# Patient Record
Sex: Female | Born: 1965
Health system: Southern US, Community
[De-identification: ages and names within clinical notes are randomized; demographics above are authoritative.]

---

## 1998-09-05 ENCOUNTER — Other Ambulatory Visit: Admission: RE | Admit: 1998-09-05 | Discharge: 1998-09-05 | Payer: Self-pay | Admitting: Obstetrics and Gynecology

## 2000-01-29 ENCOUNTER — Encounter: Payer: Self-pay | Admitting: Obstetrics and Gynecology

## 2000-01-29 ENCOUNTER — Inpatient Hospital Stay (HOSPITAL_COMMUNITY): Admission: AD | Admit: 2000-01-29 | Discharge: 2000-01-29 | Payer: Self-pay | Admitting: Obstetrics and Gynecology

## 2000-02-14 ENCOUNTER — Other Ambulatory Visit: Admission: RE | Admit: 2000-02-14 | Discharge: 2000-02-14 | Payer: Self-pay | Admitting: Obstetrics and Gynecology

## 2000-07-21 ENCOUNTER — Inpatient Hospital Stay (HOSPITAL_COMMUNITY): Admission: AD | Admit: 2000-07-21 | Discharge: 2000-07-25 | Payer: Self-pay | Admitting: Obstetrics and Gynecology

## 2000-08-25 ENCOUNTER — Other Ambulatory Visit: Admission: RE | Admit: 2000-08-25 | Discharge: 2000-08-25 | Payer: Self-pay | Admitting: Obstetrics and Gynecology

## 2001-10-06 ENCOUNTER — Other Ambulatory Visit: Admission: RE | Admit: 2001-10-06 | Discharge: 2001-10-06 | Payer: Self-pay | Admitting: Obstetrics and Gynecology

## 2001-10-17 ENCOUNTER — Emergency Department (HOSPITAL_COMMUNITY): Admission: EM | Admit: 2001-10-17 | Discharge: 2001-10-17 | Payer: Self-pay | Admitting: Emergency Medicine

## 2003-05-30 ENCOUNTER — Other Ambulatory Visit: Admission: RE | Admit: 2003-05-30 | Discharge: 2003-05-30 | Payer: Self-pay | Admitting: Obstetrics and Gynecology

## 2003-08-10 ENCOUNTER — Inpatient Hospital Stay (HOSPITAL_COMMUNITY): Admission: AD | Admit: 2003-08-10 | Discharge: 2003-08-10 | Payer: Self-pay | Admitting: Obstetrics and Gynecology

## 2004-02-07 ENCOUNTER — Ambulatory Visit (HOSPITAL_COMMUNITY): Admission: RE | Admit: 2004-02-07 | Discharge: 2004-02-07 | Payer: Self-pay | Admitting: Obstetrics and Gynecology

## 2004-02-07 ENCOUNTER — Encounter (INDEPENDENT_AMBULATORY_CARE_PROVIDER_SITE_OTHER): Payer: Self-pay | Admitting: *Deleted

## 2004-05-02 ENCOUNTER — Encounter (INDEPENDENT_AMBULATORY_CARE_PROVIDER_SITE_OTHER): Payer: Self-pay | Admitting: Specialist

## 2004-05-02 ENCOUNTER — Ambulatory Visit (HOSPITAL_COMMUNITY): Admission: RE | Admit: 2004-05-02 | Discharge: 2004-05-02 | Payer: Self-pay | Admitting: Obstetrics and Gynecology

## 2004-11-05 ENCOUNTER — Ambulatory Visit: Payer: Self-pay | Admitting: Hematology & Oncology

## 2005-01-28 ENCOUNTER — Ambulatory Visit: Payer: Self-pay | Admitting: Hematology & Oncology

## 2005-05-01 ENCOUNTER — Ambulatory Visit: Payer: Self-pay | Admitting: Hematology & Oncology

## 2005-06-02 ENCOUNTER — Inpatient Hospital Stay (HOSPITAL_COMMUNITY): Admission: RE | Admit: 2005-06-02 | Discharge: 2005-06-05 | Payer: Self-pay | Admitting: Obstetrics and Gynecology

## 2005-06-02 ENCOUNTER — Encounter (INDEPENDENT_AMBULATORY_CARE_PROVIDER_SITE_OTHER): Payer: Self-pay | Admitting: *Deleted

## 2005-06-06 ENCOUNTER — Encounter: Admission: RE | Admit: 2005-06-06 | Discharge: 2005-07-05 | Payer: Self-pay | Admitting: Obstetrics and Gynecology

## 2005-06-19 ENCOUNTER — Ambulatory Visit: Payer: Self-pay | Admitting: Hematology & Oncology

## 2005-07-06 ENCOUNTER — Encounter: Admission: RE | Admit: 2005-07-06 | Discharge: 2005-08-05 | Payer: Self-pay | Admitting: Obstetrics and Gynecology

## 2005-08-06 ENCOUNTER — Encounter: Admission: RE | Admit: 2005-08-06 | Discharge: 2005-09-04 | Payer: Self-pay | Admitting: Obstetrics and Gynecology

## 2005-09-05 ENCOUNTER — Encounter: Admission: RE | Admit: 2005-09-05 | Discharge: 2005-10-05 | Payer: Self-pay | Admitting: Obstetrics and Gynecology

## 2005-10-06 ENCOUNTER — Encounter: Admission: RE | Admit: 2005-10-06 | Discharge: 2005-11-05 | Payer: Self-pay | Admitting: Obstetrics and Gynecology

## 2005-11-06 ENCOUNTER — Encounter: Admission: RE | Admit: 2005-11-06 | Discharge: 2005-12-06 | Payer: Self-pay | Admitting: Obstetrics and Gynecology

## 2005-12-07 ENCOUNTER — Encounter: Admission: RE | Admit: 2005-12-07 | Discharge: 2006-01-01 | Payer: Self-pay | Admitting: Obstetrics and Gynecology

## 2008-02-16 ENCOUNTER — Encounter: Admission: RE | Admit: 2008-02-16 | Discharge: 2008-02-16 | Payer: Self-pay | Admitting: Obstetrics and Gynecology

## 2008-11-04 ENCOUNTER — Emergency Department (HOSPITAL_COMMUNITY): Admission: EM | Admit: 2008-11-04 | Discharge: 2008-11-04 | Payer: Self-pay | Admitting: Emergency Medicine

## 2009-02-16 ENCOUNTER — Encounter: Admission: RE | Admit: 2009-02-16 | Discharge: 2009-02-16 | Payer: Self-pay | Admitting: Obstetrics and Gynecology

## 2009-11-05 ENCOUNTER — Ambulatory Visit (HOSPITAL_COMMUNITY): Admission: RE | Admit: 2009-11-05 | Discharge: 2009-11-05 | Payer: Self-pay | Admitting: Obstetrics and Gynecology

## 2010-11-07 ENCOUNTER — Other Ambulatory Visit: Payer: Self-pay | Admitting: Obstetrics and Gynecology

## 2010-11-07 DIAGNOSIS — Z1231 Encounter for screening mammogram for malignant neoplasm of breast: Secondary | ICD-10-CM

## 2010-11-27 ENCOUNTER — Ambulatory Visit
Admission: RE | Admit: 2010-11-27 | Discharge: 2010-11-27 | Disposition: A | Discharge status: Home / Self Care | Payer: BC Managed Care – PPO | Source: Ambulatory Visit | Attending: Obstetrics and Gynecology | Admitting: Obstetrics and Gynecology

## 2010-11-27 DIAGNOSIS — Z1231 Encounter for screening mammogram for malignant neoplasm of breast: Secondary | ICD-10-CM

## 2010-11-27 LAB — CBC
Hemoglobin: 9.7 g/dL — ABNORMAL LOW (ref 12.0–15.0)
Platelets: 420 10*3/uL — ABNORMAL HIGH (ref 150–400)
RDW: 16.6 % — ABNORMAL HIGH (ref 11.5–15.5)
WBC: 5.4 10*3/uL (ref 4.0–10.5)

## 2010-11-29 ENCOUNTER — Other Ambulatory Visit: Payer: Self-pay | Admitting: Obstetrics and Gynecology

## 2010-11-29 DIAGNOSIS — R928 Other abnormal and inconclusive findings on diagnostic imaging of breast: Secondary | ICD-10-CM

## 2010-12-09 ENCOUNTER — Other Ambulatory Visit: Payer: BC Managed Care – PPO

## 2010-12-11 ENCOUNTER — Ambulatory Visit
Admission: RE | Admit: 2010-12-11 | Discharge: 2010-12-11 | Disposition: A | Discharge status: Home / Self Care | Payer: BC Managed Care – PPO | Source: Ambulatory Visit | Attending: Obstetrics and Gynecology | Admitting: Obstetrics and Gynecology

## 2010-12-11 DIAGNOSIS — R928 Other abnormal and inconclusive findings on diagnostic imaging of breast: Secondary | ICD-10-CM

## 2011-01-24 NOTE — Op Note (Signed)
Erin Snow, Erin Snow                        ACCOUNT NO.:  [id number]   MEDICAL RECORD NO.:  [id number]                   PATIENT TYPE:  AMB   LOCATION:  SDC                                  FACILITY:  [facility]   PHYSICIAN:  Lenoard Aden, M.D.             DATE OF BIRTH:  12-Jun-1966   DATE OF PROCEDURE:  02/07/2004  DATE OF DISCHARGE:                                 OPERATIVE REPORT   PREOPERATIVE DIAGNOSIS:  Missed abortion at nine weeks, recurrent pregnancy  loss.   POSTOPERATIVE DIAGNOSIS:  Missed abortion at nine weeks, recurrent pregnancy  loss.   PROCEDURE:  Suction dilatation and evacuation.   SURGEON:  Lenoard Aden, M.D.   ANESTHESIA:  MAC and paracervical.   ESTIMATED BLOOD LOSS:  Less than 50 mL.   COMPLICATIONS:  None.   DRAINS:  None.   COUNTS:  Correct.   SPECIMENS:  Tissue to pathology and for chromosomal analysis.   DISPOSITION:  Patient to the recovery room in good condition.   OPERATIVE PROCEDURE:  After being appraised of the risks of anesthesia,  infection, bleeding, uterine perforation, with possible need for repair to  bowel, bladder, and other injury to organs, with inability to completely  remove the fetal tissue, the patient was brought to the operating room where  she was administered IV sedation without complications, prepped and draped  in the usual sterile fashion, catheterized until the bladder was empty.  Exam under anesthesia reveals a 6-8 weeks anteverted uterus, no adnexal  masses.  A dilute paracervical block placed, 20 mL in 2% Xylocaine solution.  The uterus was sounded to 9 cm, easily dilated up to a #25 Pratt dilator.  A  8 mm curved suction curet was placed.  Suction was initiated and minimal to  moderate amount of tissue was obtained.  Blunt curettage in a four quadrant  method reveals the cavity to be empty.  Repeat suction curettage confirms.  Good hemostasis noted.  The instruments were removed from the vagina.  The  patient tolerated the procedure well and was transferred to the recovery  room in good condition.  Tissue sent for chromosomal analysis.                                               Lenoard Aden, M.D.    RJT/MEDQ  D:  02/07/2004  T:  02/07/2004  Job:  [id number]

## 2011-01-24 NOTE — Discharge Summary (Signed)
NAMELINCOLN, KLEINER              ACCOUNT NO.:  [id number]   MEDICAL RECORD NO.:  [id number]          PATIENT TYPE:  INP   LOCATION:  9141                          FACILITY:  [facility]   PHYSICIAN:  Lenoard Aden, M.D.DATE OF BIRTH:  Jan 14, 1966   DATE OF ADMISSION:  06/02/2005  DATE OF DISCHARGE:  06/05/2005                                 DISCHARGE SUMMARY   The patient underwent uncomplicated repeat cesarean section and tubal  ligation on June 03, 2005.  Postoperative course uncomplicated.  Discharge teaching done. Tolerated regular diet well.  Restarted on Lovenox  24 hours post cesarean section for known antiphospholipid antibody syndrome.  Discharged to home on day #3.  Discharge teaching done.  Discharge  medications are Lovenox, prenatal vitamins and Tylox.  She will follow up  with Dr. Myna Hidalgo, hematologist.  Follow up in our office in four weeks.      Lenoard Aden, M.D.  Electronically Signed     RJT/MEDQ  D:  07/06/2005  T:  07/07/2005  Job:  [id number]

## 2011-01-24 NOTE — Op Note (Signed)
[facility]  Patient:    Erin Snow, Erin Snow                       MRN: [id number] Proc. Date: 07/22/00 Attending:  Silverio Lay, M.D.                           Operative Report  PREOPERATIVE DIAGNOSIS:       Intrauterine pregnancy at 37 weeks and 2 days,                               failure to progress with arrest in transverse.  POSTOPERATIVE DIAGNOSIS:      Intrauterine pregnancy at 37 weeks and 2 days,                               failure to progress with arrest in transverse                               plus two nuchal cord loop, and one body loop.  OPERATION:                    Primary low transverse cesarean section.  SURGEON:                      Silverio Lay, M.D.  ANESTHESIA:                   Epidural.  ESTIMATED BLOOD LOSS:         600 cc.  INDICATIONS:                  This is a primigravida who had spontaneously ruptured her membranes at 3 p.m. on July 21, 2000, at 37 weeks and 2 days with clear fluid and group B strep negative who was allowed to enter into spontaneous labor.  She progressed normally from her admission exam of 1 cm to 6 cm, and remained at 6 cm for the last four hours of her labor.  Despite an adequate labor with MVUs at 190-200.  Cervical anterior lip was greatly edematous, and the baby was arrested in left transverse position at -1 station.   The decision was then made to proceed with primary low transverse cesarean section.  Procedures as well as complications including bleeding, infection, injury to bowel, bladder, and ureters were well-discussed with patient and husband, and informed consent was obtained.  DESCRIPTION OF PROCEDURE:     Pre-existing epidural was reinforced.  The patient was placed in the supine position with the pelvis tilted to the left, prepped and draped in a sterile fashion, and the Foley catheter was already in place.  After assessing an adequate level of anesthesia, the  skin, subcutaneous tissue, and fat were incised in a low transverse fashion.  The fascia was incised in a low transverse fashion.  The linea alba was dissected and peritoneum was entered in the midline fashion.  The visceral peritoneum was then incised in a low transverse fashion, allowing [place] to develop bladder flap, and safely retract bladder.  The uterus was then entered.  The myometrium was then entered in a low transverse fashion, first with the knife and then with scissors.  Amniotic fluid was clear.  We assisted the birth of a female infant at 4:49 a.m. in a left transverse presentation.  Mouth and nose were suctioned.  Two nuchal cord loops were reduced and the body of the baby was then delivered, allowing [place] to see a third body loop.  Cord was clamped with two Kelly clamps and _________, and the baby was given to the pediatrician present in the room.  Twenty cubic centimeters of blood were drawn from umbilical vein.  Placenta was allowed to spontaneously delivered with complete and cord had three vessels.  Uterine revision was negative.  We proceeded with closure of hysterotomy in two layers, first with a running locked suture of 0 Vicryl, then with a Lambert suture of 0 Vicryl covering the first one.  Hemostasis on the uterine incision was _______ and adequate. Both pericolic gutters were cleansed.  Both tubes and ovaries assessed and normal.  Hemostasis was reassessed on the uterine incision and adequate.  On the fascia, hemostasis was completed with cautery.  The fascia was closed with two running suture of 0 Vicryl meeting midline.  Fat and subcutaneous tissue were irrigated with warm saline.  Hemostasis was obtained with cautery.  The wound was infiltrated with 10 cc of Marcaine 0.25 and the skin was closed with staples.  Instruments and sponge count is complete x 2.  Estimated blood loss is 600 cc.  The baby received an Apgar of 8 at one minute and 9 at five minutes, and weighed  6 pounds and 10 ounces.                                The procedure was very well-tolerated by the patient who was taken to the recovery room in a well and stable condition. DD:  07/22/00 TD:  07/22/00 Job: [id number]

## 2011-01-24 NOTE — Op Note (Signed)
[name], Erin Snow                        ACCOUNT NO.:  [id number]   MEDICAL RECORD NO.:  [id number]                   PATIENT TYPE:  AMB   LOCATION:  SDC                                  [facility]ACILITY:  [facility]   PHYSICIAN:  Lenoard Aden, M.D.             DATE OF BIRTH:  20-Mar-1966   DATE OF PROCEDURE:  05/02/2004  DATE OF DISCHARGE:                                 OPERATIVE REPORT   PREOPERATIVE DIAGNOSIS:  Recurrent pregnancy loss, dysfunctional uterine  bleeding.   POSTOPERATIVE DIAGNOSIS:  Posterior wall endometrial polyp.   OPERATION PERFORMED:  Diagnostic hysteroscopy, resectoscopic polypectomy,  dilation and curettage.   SURGEON:  Lenoard Aden, M.D.   ANESTHESIA:  General.   ESTIMATED BLOOD LOSS:  50 mL.   COMPLICATIONS:  None.   DRAINS:  None.   COUNTS:  Correct.   Patient to recovery in good condition.   FLUID DEFICIT:  60 mL.   DESCRIPTION OF PROCEDURE:  After being apprised of the risks of anesthesia,  infection, bleeding, uterine perforation and possible need for repair, the  patient was brought to the operating room where she was administered a  general anesthetic without complications, prepped and draped in the usual  sterile fashion, catheterized until the bladder was empty.  Examination  under anesthesia revealed an anteflexed uterus and no adnexal masses.  At  this time 16 mL of a dilute Pitressin solution placed at 3 and 9 o'clock at  the cervicovaginal  junction. Cervix easily dilated up to a #31 Pratt  dilator.  Hysteroscope placed.  Visualization revealed a sessile posterior  wall polyp which was resected in multiple passes using a right angle double  loop.  D&C was performed without difficulty.  Bilateral normal tubal ostia  are noted.  No evidence of uterine septum or submucous fibroids.  At this  time the procedure was terminated.  Fluid loss deficit of 60 mL was noted.  All instruments were removed from the vagina.  No bleeding noted.   Good  hemostasis obtained.  The patient tolerated the procedure well, was awakened  and transferred to recovery in good condition.                                               Lenoard Aden, M.D.    RJT/MEDQ  D:  05/02/2004  T:  05/02/2004  Job:  [id number]

## 2011-01-24 NOTE — Discharge Summary (Signed)
[facility]  Patient:    Erin Snow, Erin Snow                     MRN: [id number] Adm. Date:  [id number] Disc. Date: [id number] Attending:  Silverio Lay A                           Discharge Summary  REASON FOR ADMISSION:         Intrauterine pregnancy at 37 weeks and two days                               with spontaneous rupture of membranes.  HISTORY OF PRESENT ILLNESS:   This is a 45 year old white female, gravida 3, para 0, with a due date of August 10, 2000 being admitted on July 21, 2000 at 37 weeks and 2 days with spontaneous rupture of membranes at 3:10 a.m. She went into spontaneous labor with a good contraction pattern but lacking some intensity. Two attempts at Pitocin augmentation were made which were non-tolerated by the baby with late decelerations and even prolonged decelerations, and this led to an absence in change of cervical exam in more than four hours with the cervix arrested at 5 to 6 cm, increasing edema on the anterior lip of the cervix, and a baby arrested in right transverse presentation.  Due to failure to progress, patient was consented for primary low transverse cesarean section.  She underwent this procedure with the birth of a female infant born at 4:49 a.m., Apgars 8 and 9, weighing 6 pounds 10 ounces without complication. Her estimated blood loss during procedure was 600 cc. During procedure, two nuchal cords and one body cord loop were found, maybe explaining the fetal heart rate pattern with more intense contractions.  Her postoperative course was uneventful and patient received her discharge home on postoperative day #3, or July 25, 2000 in a well and stable condition with a postop hemoglobin of 10.9.  FINAL DIAGNOSES:              1. Intrauterine pregnancy at 37 weeks and 2 days                                  with failure to progress.                               2. Status post primary low transverse  cesarean                                  section.  CONDITION AT DISCHARGE:       Well and stable.  MEDICATION AND RECOMMENDATION AT DISCHARGE:  Patient received a prescription of Motrin 800 mg as well as Tylox for pain control. She was instructed to come back in the office for a staple removal as well as her postpartum appointment in four to six weeks. She was also instructed to call if experiencing increased bleeding, increased pain or fever above 100.4. DD:  08/25/00 TD:  08/25/00 Job: [id number]

## 2011-01-24 NOTE — H&P (Signed)
Erin Snow, Erin Snow                        ACCOUNT NO.:  [id number]   MEDICAL RECORD NO.:  [id number]                   PATIENT TYPE:  AMB   LOCATION:  SDC                                  FACILITY:  [facility]   PHYSICIAN:  Lenoard Aden, M.D.             DATE OF BIRTH:  13-Sep-1965   DATE OF ADMISSION:  05/02/2004  DATE OF DISCHARGE:                                HISTORY & PHYSICAL   CHIEF COMPLAINT:  History of recurrent pregnancy loss with questionable  structural defect.   HISTORY OF PRESENT ILLNESS:  The patient is a 45 year old white female G5,  P1 with history of recurrent pregnancy loss and questionable structural  lesion who presents for definitive therapy.   PAST MEDICAL HISTORY:  Remarkable for SAB x 3, TAB x1 and 1 successful  pregnancy.   MEDICATIONS:  Prenatal vitamins.   ALLERGIES:  No known drug allergies.   FAMILY HISTORY:  Adult onset melanoma.   Blood type A negative.  She also has a history of cryosurgery in 1991.  She  denies domestic physical violence.  She is a smoker.   PHYSICAL EXAMINATION:  GENERAL:  She is a well-developed, well-nourished  white female in no acute distress.  HEENT:  Normal.  LUNGS:  Clear.  HEART:  Regular rhythm.  ABDOMEN:  Soft, nontender.  PELVIC EXAM:  Reveals an anteflexed uterus and no adnexal masses.   IMPRESSION:  Recurrent pregnancy loss, questionable structural lesion for  hysteroscopy.   PLAN:  Proceed with diagnostic hysteroscopy, resectoscopic surgery, D&C.  Risks of anesthesia, infection, bleeding, injury to abdominal organs and  need for _______discussed.  The patient is aware of the complications to  include bowel or bladder injury.  The patient just wishes to proceed.                                               Lenoard Aden, M.D.    RJT/MEDQ  D:  05/01/2004  T:  05/02/2004  Job:  [id number]

## 2011-01-24 NOTE — H&P (Signed)
[facility]  Patient:    Erin Snow, Erin Snow                       MRN: [id number] Adm. Date:  07/21/00 Attending:  Lenoard Aden, M.D.                         History and Physical  CHIEF COMPLAINT:              Spontaneous rupture of membranes.  HISTORY OF PRESENT ILLNESS:   A 45 year old white female, G3, P0, EDD August 10, 2000, at 37+ weeks who presents with spontaneous rupture of membranes at 3:10 today.  PAST MEDICAL HISTORY:         Remarkable for TAB and SAB in 1986.  Past medical history otherwise noncontributory.  ALLERGIES:                    No known drug allergies.  MEDICATIONS:                  Prenatal vitamins.  FAMILY HISTORY:               Adult-onset diabetes and melanoma.  PRENATAL LABORATORY DATA:     Remarkable for blood type A negative, Rh antibody negative, toxoplasmosis titers negative, rubella immune, hepatitis B surface antigen negative, HIV nonreactive.  Pap smear within normal limits. Sickle screen normal.  GBS negative.  Patient received RhoGAM at 20 weeks. Prenatal course otherwise uncomplicated.  PAST GYNECOLOGIC HISTORY:     Also remarkable for cryosurgery in 1991.  SOCIAL HISTORY:               Patient denies domestic or physical violence. She is a smoker.  PHYSICAL EXAMINATION:  GENERAL:                      A well-developed, well-nourished white female in no apparent distress.  HEENT:                        Normal.  LUNGS:                        Clear.  HEART:                        Regular rate and rhythm.  ABDOMEN:                      Soft, gravid, nontender.  Estimated fetal weight 7-1/2 to 8 pounds.  PELVIC:                       Cervix is 2 cm, 70% vertex, and -1 to -2.  NEUROLOGIC:                   Exam is nonfocal.  EXTREMITIES:                  Revealed no cords.  IMPRESSION:                   1. Term intrauterine pregnancy with spontaneous                                  rupture  of membranes.  2. Rh negative.                               3. Smoker.  PLAN:                         Proceed with Pitocin augmentation as necessary anticipated after vaginal delivery. DD:  07/21/00 TD:  07/21/00 Job: [id number]

## 2011-01-24 NOTE — Op Note (Signed)
Erin Snow, Erin Snow              ACCOUNT NO.:  [id number]   MEDICAL RECORD NO.:  [id number]          PATIENT TYPE:  INP   LOCATION:  9141                          FACILITY:  [facility]   PHYSICIAN:  Lenoard Aden, M.D.DATE OF BIRTH:  September 21, 1965   DATE OF PROCEDURE:  06/02/2005  DATE OF DISCHARGE:                                 OPERATIVE REPORT   PREOPERATIVE DIAGNOSES:  1.  Thirty-eight-week intrauterine pregnancy.  2.  Antiphospholipid antibody syndrome on Lovenox.  3.  Previous cesarean section.  4.  Desires tubal ligation.   POSTOPERATIVE DIAGNOSES:  1.  Thirty-eight-week intrauterine pregnancy.  2.  Antiphospholipid antibody syndrome on Lovenox.  3.  Previous cesarean section.  4.  Desires tubal ligation.  5.  Right paratubal cyst.   OPERATION/PROCEDURE:  1.  Repeat low segment transverse cesarean section.  2.  Tubal ligation.  3.  Removal of right paratubal cyst.   SURGEON:  Lenoard Aden, M.D.   ASSISTANT:  Maxie Better, M.D.   ANESTHESIA:  Spinal by Octaviano Glow. Pamalee Leyden, M.D.   SPECIMENS:  Paratubal cyst to pathology.  Placenta to pathology.  Bilateral  partial tubal segments to pathology.   ESTIMATED BLOOD LOSS:  1000 mL.   COMPLICATIONS:  None.   DRAINS:  Foley.   COUNTS:  Correct.   DISPOSITION:  The patient recovering in good condition.   DESCRIPTION OF PROCEDURE:  After being apprised of the risks of anesthesia,  infection, bleeding, injury to abdominal organs and need for repair, failure  risk of tubal ligation 5-10 per 1000, the patient brought to the operating  room.  She was administered spinal anesthetic without complications, prepped  and draped in the usual sterile fashion.  Foley catheter placed.  After  achieving adequate anesthesia, dilute Marcaine solution placed and  Pfannenstiel skin incision made with the scalpel, carried down to the fascia  which was nicked in the midline and opened transversely using Mayo scissors.  Rectus  muscles were dissected sharply in the midline.  Peritoneum entered  sharply and bladder blade placed.  Visceral peritoneum was scored in the  smiley fashion, dissected sharply off the lower uterine segment.  Hysterotomy incision made and extended with bandage scissors.  Atraumatic  delivery.  Nuchal cord x1.  Full-term living female.  Occiput transverse  position.  Handed to pediatricians in attendance.  Apgars 9 and 9, cord  blood collected.  Placenta delivered manually intact and sent to pathology.  Uterus exteriorized and curetted using a dry lap pack, closed in two layers  using 0 Monocryl suture.  Right tube traced out to the fimbriated end.  Right paratubal cyst is excised at the base using cauterization and sent to  pathology.   At this time, avascular portion of the ampullary portion of the tube and  avascular portion of the mesosalpinx was identified,  cauterized, creating a  window, using electrocautery, proximal and distal plane ties are placed.  Tubal segment excised. Tubal lumens were visualized and cauterized.  Same  procedure which was done on the right tube is done on the left tube except  there is no  paratubal cyst on the left tube.  Otherwise tubal ligation is  performed in the same fashion on the left tube.  Good hemostasis was noted.   Bladder flap was inspected and found to be hemostatic.  Irrigation was  accomplished.  Fascia was then closed using the 0 Monocryl in 10/20 fashion.  Skin closed using staples.  The patient tolerated the procedure well and was  transferred to the recovery room in good condition.      l      Lenoard Aden, M.D.  Electronically Signed     RJT/MEDQ  D:  06/02/2005  T:  06/02/2005  Job:  [id number]   cc:   [facility]

## 2011-05-07 ENCOUNTER — Other Ambulatory Visit: Payer: Self-pay | Admitting: Obstetrics and Gynecology

## 2011-05-07 DIAGNOSIS — N6320 Unspecified lump in the left breast, unspecified quadrant: Secondary | ICD-10-CM

## 2011-06-25 ENCOUNTER — Ambulatory Visit
Admission: RE | Admit: 2011-06-25 | Discharge: 2011-06-25 | Disposition: A | Discharge status: Home / Self Care | Payer: BC Managed Care – PPO | Source: Ambulatory Visit | Attending: Obstetrics and Gynecology | Admitting: Obstetrics and Gynecology

## 2011-06-25 DIAGNOSIS — N6320 Unspecified lump in the left breast, unspecified quadrant: Secondary | ICD-10-CM

## 2011-11-12 ENCOUNTER — Other Ambulatory Visit: Payer: Self-pay | Admitting: Obstetrics and Gynecology

## 2011-11-12 DIAGNOSIS — Z1231 Encounter for screening mammogram for malignant neoplasm of breast: Secondary | ICD-10-CM

## 2011-12-01 ENCOUNTER — Ambulatory Visit
Admission: RE | Admit: 2011-12-01 | Discharge: 2011-12-01 | Disposition: A | Discharge status: Home / Self Care | Payer: BC Managed Care – PPO | Source: Ambulatory Visit | Attending: Obstetrics and Gynecology | Admitting: Obstetrics and Gynecology

## 2011-12-01 DIAGNOSIS — Z1231 Encounter for screening mammogram for malignant neoplasm of breast: Secondary | ICD-10-CM

## 2011-12-20 ENCOUNTER — Encounter (HOSPITAL_COMMUNITY): Payer: Self-pay

## 2011-12-20 ENCOUNTER — Emergency Department (HOSPITAL_COMMUNITY): Payer: BC Managed Care – PPO

## 2011-12-20 ENCOUNTER — Emergency Department (HOSPITAL_COMMUNITY)
Admission: EM | Admit: 2011-12-20 | Discharge: 2011-12-20 | Disposition: A | Discharge status: Home / Self Care | Payer: BC Managed Care – PPO | Attending: Emergency Medicine | Admitting: Emergency Medicine

## 2011-12-20 DIAGNOSIS — S239XXA Sprain of unspecified parts of thorax, initial encounter: Secondary | ICD-10-CM | POA: Insufficient documentation

## 2011-12-20 DIAGNOSIS — R059 Cough, unspecified: Secondary | ICD-10-CM | POA: Insufficient documentation

## 2011-12-20 DIAGNOSIS — Z7982 Long term (current) use of aspirin: Secondary | ICD-10-CM | POA: Insufficient documentation

## 2011-12-20 DIAGNOSIS — M546 Pain in thoracic spine: Secondary | ICD-10-CM | POA: Insufficient documentation

## 2011-12-20 DIAGNOSIS — T148XXA Other injury of unspecified body region, initial encounter: Secondary | ICD-10-CM

## 2011-12-20 DIAGNOSIS — R079 Chest pain, unspecified: Secondary | ICD-10-CM | POA: Insufficient documentation

## 2011-12-20 DIAGNOSIS — X503XXA Overexertion from repetitive movements, initial encounter: Secondary | ICD-10-CM | POA: Insufficient documentation

## 2011-12-20 DIAGNOSIS — Z79899 Other long term (current) drug therapy: Secondary | ICD-10-CM | POA: Insufficient documentation

## 2011-12-20 DIAGNOSIS — R05 Cough: Secondary | ICD-10-CM | POA: Insufficient documentation

## 2011-12-20 MED ORDER — HYDROCODONE-ACETAMINOPHEN 5-325 MG PO TABS: 1.0000 | ORAL_TABLET | ORAL | Status: AC | PRN

## 2011-12-20 MED ORDER — KETOROLAC TROMETHAMINE 60 MG/2ML IM SOLN
60.0000 mg | Freq: Once | INTRAMUSCULAR | Status: AC
  Administered 2011-12-20: 60 mg via INTRAMUSCULAR
  Filled 2011-12-20: qty 2

## 2011-12-20 MED ORDER — IBUPROFEN 800 MG PO TABS: 800.0000 mg | ORAL_TABLET | Freq: Three times a day (TID) | ORAL | Status: AC

## 2011-12-20 NOTE — ED Notes (Signed)
PA at bedside.

## 2011-12-20 NOTE — Discharge Instructions (Signed)
Take vicodin as prescribed for severe pain.   Do not drive within four hours of taking this medication (may cause drowsiness or confusion).  Take ibuprofen w/ food up to three times a day as well.   Use incentive spirometer once an hour while your awake until it no longer hurts to breath.  Apply heat or ice 2-3 times a day.  Avoid activities that aggravate pain.  Follow up with your family doctor if pain has not started to improve in 5-7 days.  You should return to the ER if your pain worsens, you develop difficulty breathing or associated fever.

## 2011-12-20 NOTE — ED Notes (Signed)
Pt in from home  with c/o back pain states injures several weeks ago states while bending today pain worsened denies pain radiating to legs states heard a "pop" sound when bending

## 2011-12-20 NOTE — ED Provider Notes (Signed)
History     CSN: [id number]  Arrival date & time 12/20/11  1327   First MD Initiated Contact with Patient 12/20/11 1515      Chief Complaint  Patient presents with  . Back Pain    (Consider location/radiation/quality/duration/timing/severity/associated sxs/prior treatment) HPI History provided by pt.   Pt has had soreness right posterior ribs for the past 3-4 months since leaning against a shelf while inside a cabinet, painting.  Today she bent over and had a sharp pain in the same location.  Pain has been constant and severe ever since and aggravated by movement and coughing.  Pain feels similar to what she had been experiencing, but worse.  Has mild cough that she attributes to allergies.  Denies fever, SOB, LE pain/edema.  No RF for PE.    History reviewed. No pertinent past medical history.  History reviewed. No pertinent past surgical history.  No family history on file.  History  Substance Use Topics  . Smoking status: Current Everyday Smoker  . Smokeless tobacco: Not on file  . Alcohol Use: No    OB History    Grav Para Term Preterm Abortions TAB SAB Ect Mult Living                  Review of Systems  All other systems reviewed and are negative.    Allergies  Wellbutrin  Home Medications   Current Outpatient Rx  Name Route Sig Dispense Refill  . ASPIRIN EC 81 MG PO TBEC Oral Take 81 mg by mouth daily.    Marland Kitchen CALCIUM CARBONATE-VITAMIN D 500-200 MG-UNIT PO TABS Oral Take 1 tablet by mouth daily.    Marland Kitchen JUICE PLUS FIBRE PO Oral Take 1 capsule by mouth every morning.    Marland Kitchen SERTRALINE HCL 100 MG PO TABS Oral Take 100 mg by mouth daily.      BP 116/88  Pulse 76  Temp(Src) 98.6 F (37 C) (Oral)  Resp 20  SpO2 100%  LMP 11/11/2011  Physical Exam  Nursing note and vitals reviewed. Constitutional: She is oriented to person, place, and time. She appears well-developed and well-nourished. No distress.  HENT:  Head: Normocephalic and atraumatic.  Eyes:   Normal appearance  Neck: Normal range of motion.  Cardiovascular: Normal rate and regular rhythm.   Pulmonary/Chest: Effort normal and breath sounds normal.       Pain w/ deep inspiration  Musculoskeletal: Normal range of motion.       Entire spine non-tender.  Mild tenderness right upper side but upper back non-tender.  Pain aggravated by passive abduction RUE and twisting of torso to the left.  No LE edema/tenderness.  Neurological: She is alert and oriented to person, place, and time.  Skin: Skin is warm and dry. No rash noted.       No ecchymosis  Psychiatric: She has a normal mood and affect. Her behavior is normal.    ED Course  Procedures (including critical care time)  Labs Reviewed - No data to display Dg Ribs Unilateral W/chest Right  12/20/2011  *RADIOLOGY REPORT*  Clinical Data: Back pain.  Right axillary rib injury.  RIGHT RIBS AND CHEST - 3+ VIEW  Comparison: None.  Findings: This is irregularity and nodularity of the left anterior first and second ribs.  This could represent old fractures or a developmental variant. Linear density at the right lung base may represent scarring.  There is lucency in the upper lung fields.  No evidence for a pneumothorax.  No evidence for a displaced right rib fracture.  IMPRESSION: No evidence for a displaced right rib fracture.  Unusual appearance of the left first and second ribs could represent an old injury versus a developmental variant.  Marked lucency in the upper lungs.  Findings raise concern for underlying lung disease and cannot exclude emphysematous changes.  Original Report Authenticated By: Richarda Overlie, M.D.     1. Muscle strain       MDM  Pt presents w/ right upper back and side pain x 3-4 weeks, attributed to laying on this side while painting a cabinet.  Re-aggravated today when she bent over.  Pain reproducible on exam w/ ROM RUE, twisting torso to left, palpation of right side and deep inspiration.  Pneumonia and PE unlikely  based on history and exam.  Xray ribs pending to r/o fracture.  Suspect muscle strain.  Toradol ordered for pain.  3:24 PM   CXR neg for rib fx.  Pt is aware of findings consistent w/ emphysema and will discuss with her doctor and try to cut back on cigarettes.  Received and incentive spirometer and d/c'd home w/ ibuprofen and vicodin for pain.  Had relief w/ toradol.  Return precautions discussed.        Otilio Miu, [place] 12/20/11 [phone]

## 2011-12-20 NOTE — ED Notes (Signed)
RT called for Incentive Spirometer, pt aware.

## 2011-12-21 NOTE — ED Provider Notes (Signed)
Medical screening examination/treatment/procedure(s) were performed by non-physician practitioner and as supervising physician I was immediately available for consultation/collaboration.   Graysyn Bache E Joseh Sjogren, MD 12/21/11 1543 

## 2013-01-24 ENCOUNTER — Other Ambulatory Visit: Payer: Self-pay

## 2013-01-24 DIAGNOSIS — Z1231 Encounter for screening mammogram for malignant neoplasm of breast: Secondary | ICD-10-CM

## 2013-02-11 ENCOUNTER — Ambulatory Visit
Admission: RE | Admit: 2013-02-11 | Discharge: 2013-02-11 | Disposition: A | Discharge status: Home / Self Care | Payer: BC Managed Care – PPO | Source: Ambulatory Visit

## 2013-02-11 DIAGNOSIS — Z1231 Encounter for screening mammogram for malignant neoplasm of breast: Secondary | ICD-10-CM

## 2014-06-12 ENCOUNTER — Other Ambulatory Visit: Payer: Self-pay

## 2014-06-12 DIAGNOSIS — Z1239 Encounter for other screening for malignant neoplasm of breast: Secondary | ICD-10-CM

## 2014-06-29 ENCOUNTER — Other Ambulatory Visit: Payer: Self-pay

## 2014-06-29 DIAGNOSIS — Z1231 Encounter for screening mammogram for malignant neoplasm of breast: Secondary | ICD-10-CM

## 2014-07-05 ENCOUNTER — Ambulatory Visit
Admission: RE | Admit: 2014-07-05 | Discharge: 2014-07-05 | Disposition: A | Discharge status: Home / Self Care | Payer: BC Managed Care – PPO | Source: Ambulatory Visit

## 2014-07-05 DIAGNOSIS — Z1231 Encounter for screening mammogram for malignant neoplasm of breast: Secondary | ICD-10-CM

## 2016-01-03 DIAGNOSIS — F32A Depression, unspecified: Secondary | ICD-10-CM | POA: Insufficient documentation

## 2016-01-03 DIAGNOSIS — F329 Major depressive disorder, single episode, unspecified: Secondary | ICD-10-CM | POA: Insufficient documentation

## 2016-01-03 DIAGNOSIS — F17200 Nicotine dependence, unspecified, uncomplicated: Secondary | ICD-10-CM | POA: Insufficient documentation

## 2017-11-16 ENCOUNTER — Ambulatory Visit: Payer: BLUE CROSS/BLUE SHIELD | Admitting: Podiatry

## 2017-11-16 ENCOUNTER — Ambulatory Visit (INDEPENDENT_AMBULATORY_CARE_PROVIDER_SITE_OTHER): Payer: BLUE CROSS/BLUE SHIELD

## 2017-11-16 DIAGNOSIS — M722 Plantar fascial fibromatosis: Secondary | ICD-10-CM | POA: Diagnosis not present

## 2017-11-16 MED ORDER — MELOXICAM 15 MG PO TABS: 15.0000 mg | ORAL_TABLET | Freq: Every day | ORAL | 0 refills | Status: DC

## 2017-11-16 NOTE — Progress Notes (Signed)
   Subjective:    Patient ID: Erin Snow, female    DOB: March 16, 1966, 52 y.o.   MRN: [id number]  HPI    Review of Systems  All other systems reviewed and are negative.      Objective:   Physical Exam        Assessment & Plan:

## 2017-11-17 NOTE — Progress Notes (Signed)
   Subjective: Patient presents today for pain and tenderness in the heels bilaterally that began about 3 months ago. She reports an associated nodule noted to the left arch. She states that it hurts in the morning with the first steps out of bed and that the left foot is worse than the right. She has not done anything to treat the symptoms. Walking and bearing weight increases the pain. Patient presents today for further treatment and evaluation.  No past medical history on file.   Objective: Physical Exam General: The patient is alert and oriented x3 in no acute distress.  Dermatology: Skin is warm, dry and supple bilateral lower extremities. Negative for open lesions or macerations bilateral.   Vascular: Dorsalis Pedis and Posterior Tibial pulses palpable bilateral.  Capillary fill time is immediate to all digits.  Neurological: Epicritic and protective threshold intact bilateral.   Musculoskeletal: Tenderness to palpation to the plantar aspect of the bilateral heels along the plantar fascia. All other joints range of motion within normal limits bilateral. Strength 5/5 in all groups bilateral.   Radiographic exam: Normal osseous mineralization. Joint spaces preserved. No fracture/dislocation/boney destruction. No other soft tissue abnormalities or radiopaque foreign bodies.   Assessment: 1. plantar fasciitis bilateral feet  Plan of Care:  1. Patient evaluated. Xrays reviewed.   2. Injection of 0.5cc Celestone soluspan injected into the bilateral heels.  3. Prescription for Meloxicam provided to patient.  4. Appointment with Raiford Nobleick for custom molded orthotics.  5. Plantar fascial band(s) dispensed for bilateral plantar fasciitis. 6. Instructed patient regarding therapies and modalities at home to alleviate symptoms.  7. Return to clinic in 4 weeks.    Felecia ShellingBrent M. Kamaiya Antilla, DPM [facility]  Dr. Felecia ShellingBrent M. Bj Morlock, DPM    [address]                                     [place], [place] [address]                Office [phone]  Fax [phone]

## 2017-11-30 ENCOUNTER — Ambulatory Visit (INDEPENDENT_AMBULATORY_CARE_PROVIDER_SITE_OTHER): Payer: BLUE CROSS/BLUE SHIELD | Admitting: Orthotics

## 2017-11-30 DIAGNOSIS — M722 Plantar fascial fibromatosis: Secondary | ICD-10-CM | POA: Diagnosis not present

## 2017-11-30 NOTE — Progress Notes (Signed)

## 2017-12-14 ENCOUNTER — Ambulatory Visit: Payer: BLUE CROSS/BLUE SHIELD | Admitting: Podiatry

## 2017-12-14 DIAGNOSIS — M722 Plantar fascial fibromatosis: Secondary | ICD-10-CM

## 2017-12-16 NOTE — Progress Notes (Signed)
   Subjective: 52 year old female presenting today for follow up evaluation of bilateral plantar fasciitis. She states the pain has improved but is still present intermittently in the left heel. The injections helped provided some relief of the pain. Patient presents today for further treatment and evaluation.  No past medical history on file.   Objective: Physical Exam General: The patient is alert and oriented x3 in no acute distress.  Dermatology: Skin is warm, dry and supple bilateral lower extremities. Negative for open lesions or macerations bilateral.   Vascular: Dorsalis Pedis and Posterior Tibial pulses palpable bilateral.  Capillary fill time is immediate to all digits.  Neurological: Epicritic and protective threshold intact bilateral.   Musculoskeletal: Tenderness to palpation to the plantar aspect of the bilateral heels along the plantar fascia. All other joints range of motion within normal limits bilateral. Strength 5/5 in all groups bilateral.    Assessment: 1. plantar fasciitis bilateral feet - improved   Plan of Care:  1. Patient evaluated.  2. Continue taking OTC Motrin and wearing plantar fascial braces.  3. Patient picks up custom molded orthotics in one week.  4. Return to clinic as needed.    Felecia ShellingBrent M. Evans, DPM [facility]  Dr. Felecia ShellingBrent M. Evans, DPM    [address]                                   [place], [place] [address]                Office [phone]  Fax [phone]

## 2017-12-17 ENCOUNTER — Other Ambulatory Visit: Payer: Self-pay | Admitting: Podiatry

## 2017-12-21 ENCOUNTER — Other Ambulatory Visit: Payer: BLUE CROSS/BLUE SHIELD | Admitting: Orthotics

## 2017-12-28 ENCOUNTER — Ambulatory Visit: Payer: BLUE CROSS/BLUE SHIELD | Admitting: Orthotics

## 2017-12-28 DIAGNOSIS — M722 Plantar fascial fibromatosis: Secondary | ICD-10-CM

## 2017-12-28 NOTE — Progress Notes (Signed)
Patient came in today to pick up custom made foot orthotics.  The goals were accomplished and the patient reported no dissatisfaction with said orthotics.  Patient was advised of breakin period and how to report any issues. 

## 2018-07-12 ENCOUNTER — Other Ambulatory Visit: Payer: Self-pay | Admitting: Physician Assistant

## 2018-07-12 ENCOUNTER — Ambulatory Visit
Admission: RE | Admit: 2018-07-12 | Discharge: 2018-07-12 | Disposition: A | Discharge status: Home / Self Care | Payer: BLUE CROSS/BLUE SHIELD | Source: Ambulatory Visit | Attending: Physician Assistant | Admitting: Physician Assistant

## 2018-07-12 DIAGNOSIS — R0609 Other forms of dyspnea: Principal | ICD-10-CM

## 2018-07-12 DIAGNOSIS — R0600 Dyspnea, unspecified: Secondary | ICD-10-CM

## 2018-09-24 ENCOUNTER — Ambulatory Visit
Admission: RE | Admit: 2018-09-24 | Discharge: 2018-09-24 | Disposition: A | Discharge status: Home / Self Care | Payer: BLUE CROSS/BLUE SHIELD | Source: Ambulatory Visit | Attending: Physician Assistant | Admitting: Physician Assistant

## 2018-09-24 ENCOUNTER — Other Ambulatory Visit: Payer: Self-pay | Admitting: Physician Assistant

## 2018-09-24 DIAGNOSIS — R0602 Shortness of breath: Secondary | ICD-10-CM

## 2018-10-06 ENCOUNTER — Ambulatory Visit: Payer: BLUE CROSS/BLUE SHIELD | Admitting: Internal Medicine

## 2018-10-06 ENCOUNTER — Encounter: Payer: Self-pay | Admitting: Internal Medicine

## 2018-10-06 VITALS — BP 122/80 | HR 76 | Ht 64.0 in | Wt 156.0 lb

## 2018-10-06 DIAGNOSIS — J986 Disorders of diaphragm: Secondary | ICD-10-CM | POA: Diagnosis not present

## 2018-10-06 DIAGNOSIS — R0609 Other forms of dyspnea: Secondary | ICD-10-CM | POA: Diagnosis not present

## 2018-10-06 DIAGNOSIS — Z87891 Personal history of nicotine dependence: Secondary | ICD-10-CM | POA: Diagnosis not present

## 2018-10-06 DIAGNOSIS — R059 Cough, unspecified: Secondary | ICD-10-CM

## 2018-10-06 DIAGNOSIS — R05 Cough: Secondary | ICD-10-CM | POA: Diagnosis not present

## 2018-10-06 DIAGNOSIS — R0600 Dyspnea, unspecified: Secondary | ICD-10-CM

## 2018-10-06 DIAGNOSIS — R053 Chronic cough: Secondary | ICD-10-CM

## 2018-10-06 LAB — NITRIC OXIDE: Nitric Oxide: 8

## 2018-10-06 NOTE — Progress Notes (Signed)
Subjective:    Patient ID: Erin Snow, female    DOB: [birth year], 53 y.o.   MRN: [id number]  PCP [facility]   HPI  [facility] 10/06/2018   Chief Complaint  Patient presents with  . Pulm Consult    Referred by Rueben Bash PA for SOB. Has had bronchitis in the past and since then, has been more SOB. Unable to keep up with daily activities.    Erin Snow - 52 y.o. 07-10-1966 -of [address] [place] [address] -reports insidious onset of shortness of breath and cough.  Of these the shortness of breath is the more significant problem.  As a background she smoked 1.2 packs/day for 35 years and quit 2 years ago.  She also reports remote history of some kind of a neuromuscular wasting process in her left shoulder and right hand for which she has been evaluated at [facility] and [place] years ago [place] review on care everywhere does not show any evidence of this].  Occasionally she gets muscle fasciculations. She has not been bothered by this and the condition has been deemed idiopathic.  Then in October 2019 she slipped and fell on her left side having bruising injuries to her left chest wall and also fracture to her left forearm which was operated [facility].  We do not have a chest x-ray from this time.  Then in November 2019 she says she was gardening when had some mold exposure and then subsequently picked up a bronchitis and was treated with a round of antibiotic and prednisone.  Chest x-ray November 2019 sometime during the illness or after the illness shows a left hemidiaphragm elevation but otherwise clear lung fields [she is unaware of this but will this visit].  This is according my personal visualization.  She states since the respiratory illness she is been left with her shortness of breath and cough.  Between these the cough is a milder symptom.  Currently the cough is continuing to improve and is a level 4 out of 10  with 10 being the most severe.  It is dry cough.  But she is continuing to be plagued by significant shortness of breath at the level 8/10.  It is present on exertion such as walking across the room and relieved by rest.  This is unimproved and persistent.  She also feels extremely tight in her chest and she feels something is then on the left side of the chest.  She had a chest x-ray in January 2020 but continues to show left hemidiaphragm elevation [again she is not aware of this].  This is upon my personal visualization.  There is no wheezing or edema or chest pain    Chest x-ray personally visualized shows significant left hemidiaphragmatic elevation in January 2020 and November 2019.  These are new findings compared to 2013.  Her exhaled nitric oxide today in our office was normal at 8 ppb  Simple office walk 185 feet x  3 laps goal with forehead probe 10/06/2018   O2 used Room air  Number laps completed 3  Comments about pace fast  Resting Pulse Ox/HR 99% and 83/min  Final Pulse Ox/HR 95% and 101/min  Desaturated </= 88% no  Desaturated <= 3% points Yes, 4 point  Got Tachycardic >/= 90/min yes  Symptoms at end of test Towards 2nd lap dyspneic but able to continue  Miscellaneous comments x       Review of  Systems  Constitutional: Negative for fever and unexpected weight change.  HENT: Negative for congestion, dental problem, ear pain, nosebleeds, postnasal drip, rhinorrhea, sinus pressure, sneezing, sore throat and trouble swallowing.   Eyes: Negative for redness and itching.  Respiratory: Positive for cough, chest tightness and shortness of breath. Negative for wheezing.   Cardiovascular: Negative for palpitations and leg swelling.  Gastrointestinal: Negative for nausea and vomiting.  Genitourinary: Negative for dysuria.  Musculoskeletal: Negative for joint swelling.  Skin: Negative for rash.  Allergic/Immunologic: Negative.  Negative for environmental allergies, food  allergies and immunocompromised state.  Neurological: Negative for headaches.  Hematological: Does not bruise/bleed easily.  Psychiatric/Behavioral: Negative for dysphoric mood. The patient is not nervous/anxious.      has no past medical history on file.   reports that she quit smoking about 2 years ago. Her smoking use included cigarettes. She has a 45.00 pack-year smoking history. She has never used smokeless tobacco. - > 30 ppd  No past surgical history on file.  Allergies  Allergen Reactions  . Oxycodone-Acetaminophen Swelling  . Wellbutrin [Bupropion Hcl] Swelling     There is no immunization history on file for this patient.  No family history on file.   Current Outpatient Medications:  .  ALPRAZolam (XANAX) 0.5 MG tablet, , Disp: , Rfl: 0 .  aspirin EC 81 MG tablet, Take 81 mg by mouth daily., Disp: , Rfl:  .  JEVANTIQUE LO 0.5-2.5 MG-MCG tablet, , Disp: , Rfl: 11 .  Nutritional Supplements (JUICE PLUS FIBRE PO), Take 1 capsule by mouth every morning., Disp: , Rfl:  .  sertraline (ZOLOFT) 100 MG tablet, Take 100 mg by mouth daily., Disp: , Rfl:       Objective:   Physical Exam  Vitals:   10/06/18 0909  BP: 122/80  Pulse: 76  SpO2: 94%  Weight: 156 lb (70.8 kg)  Height: 5\' 4"  (1.626 m)    Estimated body mass index is 26.78 kg/m as calculated from the following:   Height as of this encounter: 5\' 4"  (1.626 m).   Weight as of this encounter: 156 lb (70.8 kg).  General Appearance:    Alert, cooperative, no distress, appears stated age - yes , sitting on - chair  Head:    Normocephalic, without obvious abnormality, atraumatic  Eyes:    PERRL, conjunctiva/corneas clear,  Ears:    Normal TM's and external ear canals, both ears  Nose:   Nares normal, septum midline, mucosa normal, no drainage    or sinus tenderness. OXYGEN ON no @ ra  Throat:   Lips, mucosa, and tongue normal; teeth and gums normal. Cyanosis on lips - no  Neck:   Supple, symmetrical, trachea  midline, no adenopathy;    thyroid:  no enlargement/tenderness/nodules; no carotid   bruit or JVD  Back:     Symmetric, no curvature, ROM normal, no CVA tenderness  Lungs:     Distress - no , Wheeze no, Barrell Chest - no, Purse lip breathing - no, Crackles - no. DIMINISHED LEFT BASE   Chest Wall:    No tenderness or deformity. Scars in chest no.  Left shoulder does show some muscular wasting   Heart:    Regular rate and rhythm, S1 and S2 normal, no murmur, rub   or gallop  Breast Exam:    NOT DONE  Abdomen:     Soft, non-tender, bowel sounds active all four quadrants,    no masses, no organomegaly  Genitalia:  NOT DONE  Rectal:   NOT DONE  Extremities:   Extremities normal, atraumatic, Clubbing - no, Edema - no.  Around the webspaces of her right hand there is some muscular wasting.  Pulses:   2+ and symmetric all extremities  Skin:   Stigmata of Connective Tissue Disease - no  Lymph nodes:   Cervical, supraclavicular, and axillary nodes normal  Psychiatric:  Neurologic:   plesant CNII-XII intact, normal strength, sensation  throughout            Assessment & Plan:     ICD-10-CM   1. Dyspnea on exertion R06.09 Pulmonary function test  2. Chronic cough R05   3. 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 Stopped smoking with greater than 30 pack year history Z87.891 Basic metabolic panel  5. Cough R05 CT Chest High Resolution    CT Chest W Contrast    Her dyspnea Is probably being derived by her left diaphragm elevation.  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  This in turn could be possibly post viral in nature or related to the fall in October 2019 a probably related to her neuromuscular weakness.  This is yet to be determined.  In addition she could have lung parenchymal disease from her smoking and this could be in the form of emphysema interstitial lung disease.  Therefore best to evaluate for both  Patient Instructions     ICD-10-CM   1. Dyspnea on  exertion R06.09   2. Chronic cough R05   3. 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 Stopped smoking with greater than 30 pack year history Z87.891     Do sniff test Do full PFT Do HRCT supine and prone and then do Do CT chest with contrast (do blood bmet test for this)  - yes 2 types of ct chest but in same setting  Followup Next few to several weeks but after completing above   -Might have to consider referral to another tertiary center such as Kendell Bane at [facility] based on the results of these tests particularly in the context of neuromuscular wasting issues.      SIGNATURE    Dr. Kalman Shan, M.D., F.C.C.P,  Pulmonary and Critical Care Medicine Staff Physician, [facility] Center Director - Interstitial Lung Disease  Program  Pulmonary Fibrosis [facility] [place], [place], [address]  Pager: [phone], If no answer or between  15:00h - 7:00h: call [phone] Telephone: [phone]  9:57 AM 10/06/2018

## 2018-10-06 NOTE — Addendum Note (Signed)
Addended by: Maurene Capes on: 10/06/2018 10:01 AM   Modules accepted: Orders

## 2018-10-06 NOTE — Addendum Note (Signed)
Addended by: Wyvonne Lenz on: 10/06/2018 10:09 AM   Modules accepted: Orders

## 2018-10-06 NOTE — Patient Instructions (Signed)
ICD-10-CM   1. Dyspnea on exertion R06.09   2. Chronic cough R05   3. 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 Stopped smoking with greater than 30 pack year history Z87.891     Do sniff test Do full PFT Do HRCT supine and prone and then do Do CT chest with contrast (do blood bmet test for this)  - yes 2 types of ct chest but in same setting  Followup Next few to several weeks but after completing above

## 2018-10-15 ENCOUNTER — Telehealth: Payer: Self-pay | Admitting: Internal Medicine

## 2018-10-15 NOTE — Telephone Encounter (Signed)
Pt's CT for 10/22/2018 has been authorized per the [facility] Portal, auth# [id number] from 10/06/18-11/04/18.  LM on pt's VM to let her know this has been authorized.

## 2018-10-15 NOTE — Telephone Encounter (Signed)
Patient called stating that her insurance denied her CT Chest ordered by Dr Marchelle Gearing.  She stated that it needed to be resubmitted.  Message routed to [facility]

## 2018-10-21 ENCOUNTER — Other Ambulatory Visit (HOSPITAL_COMMUNITY): Payer: BLUE CROSS/BLUE SHIELD

## 2018-10-21 ENCOUNTER — Ambulatory Visit (HOSPITAL_COMMUNITY): Payer: BLUE CROSS/BLUE SHIELD

## 2018-10-22 ENCOUNTER — Ambulatory Visit (HOSPITAL_COMMUNITY)
Admission: RE | Admit: 2018-10-22 | Discharge: 2018-10-22 | Disposition: A | Discharge status: Home / Self Care | Payer: BLUE CROSS/BLUE SHIELD | Source: Ambulatory Visit | Attending: Internal Medicine | Admitting: Internal Medicine

## 2018-10-22 ENCOUNTER — Other Ambulatory Visit: Payer: Self-pay | Admitting: Internal Medicine

## 2018-10-22 DIAGNOSIS — J986 Disorders of diaphragm: Secondary | ICD-10-CM | POA: Insufficient documentation

## 2018-10-22 DIAGNOSIS — R05 Cough: Secondary | ICD-10-CM | POA: Diagnosis not present

## 2018-10-22 DIAGNOSIS — R059 Cough, unspecified: Secondary | ICD-10-CM

## 2018-10-22 MED ORDER — IOHEXOL 300 MG/ML  SOLN
75.0000 mL | Freq: Once | INTRAMUSCULAR | Status: AC | PRN
  Administered 2018-10-22: 75 mL via INTRAVENOUS

## 2018-10-22 MED ORDER — SODIUM CHLORIDE (PF) 0.9 % IJ SOLN
INTRAMUSCULAR | Status: AC
  Filled 2018-10-22: qty 50

## 2018-10-27 ENCOUNTER — Ambulatory Visit (INDEPENDENT_AMBULATORY_CARE_PROVIDER_SITE_OTHER): Payer: BLUE CROSS/BLUE SHIELD | Admitting: Internal Medicine

## 2018-10-27 DIAGNOSIS — R0609 Other forms of dyspnea: Secondary | ICD-10-CM

## 2018-10-27 DIAGNOSIS — R0600 Dyspnea, unspecified: Secondary | ICD-10-CM

## 2018-10-27 LAB — PULMONARY FUNCTION TEST
DL/VA % pred: 71 %
DL/VA: 3.06 ml/min/mmHg/L
DLCO unc % pred: 63 %
DLCO unc: 13.18 ml/min/mmHg
FEF 25-75 Post: 1.24 L/sec
FEF 25-75 Pre: 0.91 L/sec
FEF2575-%Change-Post: 36 %
FEF2575-%PRED-POST: 46 %
FEF2575-%Pred-Pre: 33 %
FEV1-%Change-Post: 8 %
FEV1-%Pred-Post: 69 %
FEV1-%Pred-Pre: 64 %
FEV1-POST: 1.92 L
FEV1-Pre: 1.77 L
FEV1FVC-%Change-Post: 2 %
FEV1FVC-%Pred-Pre: 80 %
FEV6-%Change-Post: 7 %
FEV6-%Pred-Post: 84 %
FEV6-%Pred-Pre: 79 %
FEV6-Post: 2.88 L
FEV6-Pre: 2.69 L
FEV6FVC-%Change-Post: 1 %
FEV6FVC-%PRED-PRE: 100 %
FEV6FVC-%Pred-Post: 101 %
FVC-%Change-Post: 6 %
FVC-%PRED-POST: 83 %
FVC-%Pred-Pre: 78 %
FVC-Post: 2.92 L
FVC-Pre: 2.75 L
POST FEV1/FVC RATIO: 66 %
Post FEV6/FVC ratio: 99 %
Pre FEV1/FVC ratio: 65 %
Pre FEV6/FVC Ratio: 98 %
RV % PRED: 139 %
RV: 2.55 L
TLC % pred: 103 %
TLC: 5.23 L

## 2018-10-27 NOTE — Progress Notes (Signed)
PFT done today. 

## 2018-10-29 ENCOUNTER — Ambulatory Visit: Payer: BLUE CROSS/BLUE SHIELD | Admitting: Internal Medicine

## 2018-10-29 ENCOUNTER — Encounter: Payer: Self-pay | Admitting: Internal Medicine

## 2018-10-29 VITALS — BP 98/60 | HR 81 | Ht 64.0 in | Wt 162.0 lb

## 2018-10-29 DIAGNOSIS — R0609 Other forms of dyspnea: Secondary | ICD-10-CM

## 2018-10-29 DIAGNOSIS — J449 Chronic obstructive pulmonary disease, unspecified: Secondary | ICD-10-CM

## 2018-10-29 DIAGNOSIS — J986 Disorders of diaphragm: Secondary | ICD-10-CM

## 2018-10-29 DIAGNOSIS — R0600 Dyspnea, unspecified: Secondary | ICD-10-CM

## 2018-10-29 MED ORDER — TIOTROPIUM BROMIDE MONOHYDRATE 2.5 MCG/ACT IN AERS: 2.0000 | INHALATION_SPRAY | Freq: Every day | RESPIRATORY_TRACT | 11 refills | Status: DC

## 2018-10-29 MED ORDER — TIOTROPIUM BROMIDE MONOHYDRATE 2.5 MCG/ACT IN AERS: 2.0000 | INHALATION_SPRAY | Freq: Every day | RESPIRATORY_TRACT | 0 refills | Status: DC

## 2018-10-29 NOTE — Progress Notes (Signed)
Patient seen in the office today and instructed on use of Spiriva Respimat 2.5.  Patient expressed understanding and demonstrated technique. 

## 2018-10-29 NOTE — Addendum Note (Signed)
Addended by: Demetrio Lapping E on: 10/29/2018 11:23 AM   Modules accepted: Orders

## 2018-10-29 NOTE — Addendum Note (Signed)
Addended by: Wyvonne Lenz on: 10/29/2018 11:09 AM   Modules accepted: Orders

## 2018-10-29 NOTE — Patient Instructions (Addendum)
ICD-10-CM   1. Dyspnea on exertion R06.09   2. 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 Stage 2 moderate COPD by GOLD classification (HCC) J44.9     Shortness of breath is because of elevated left diaphragm that is paralyzed and also new diagnosis of COPD  Your lung function is around 64% which classifies you as moderate COPD but some of this reduction is because of the diaphragm issue  Glad you quit smoking  Currently CT scan of the chest does not have pulmonary fibrosis or pneumonia or lung cancer and this is good news  PLAN=- -start Spiriva Respimat and take it scheduled daily; take sample and prescription and learn inhaler technique -Continue albuterol as needed -Check alpha-1 antitrypsin deficiency enzyme phenotype blood test today -Do daily incentive spirometry 10 times each time and at least 5 times daily for the next 1 year in an effort to reactivate your diaphragm  Follow-up - 3 months or sooner

## 2018-10-29 NOTE — Progress Notes (Signed)
PCP [place], [facility]   HPI  IOV 10/06/2018   Chief Complaint  Patient presents with  . Pulm Consult    Referred by Erin BashBreejante Williams PA for SOB. Has had bronchitis in the past and since then, has been more SOB. Unable to keep up with daily activities.    Erin Snow - 52 y.o. 07/07/1966 -[address] [place] [id number] -reports insidious onset of shortness of breath and cough.  Of these the shortness of breath is the more significant problem.  As a background she smoked 1.2 packs/day for 35 years and quit 2 years ago.  She also reports remote history of some kind of a neuromuscular wasting process in her left shoulder and right hand for which [facility] has [facility] [place] years ago [place][chart review on care everywhere does not show any evidence of this].  Occasionally she gets muscle fasciculations. She has not been bothered by this and the condition has been deemed idiopathic.    Then in October 2019 she slipped and fell on her left side having bruising injuries to her left chest wall and also fracture to her left forearm which [facility].  We do not have a chest x-ray from this time.  Then in November 2019 she says she was gardening when had some mold exposure and then subsequently picked up a bronchitis and was treated with a round of antibiotic and prednisone.  Chest x-ray November 2019 sometime during the illness or after the illness shows a left hemidiaphragm elevation but otherwise clear lung fields [she is unaware of this but will this visit].  This is according my personal visualization.  She states since the respiratory illness she is been left with her shortness of breath and cough.  Between these the cough is a milder symptom.  Currently the cough is continuing to improve and is a level 4 out of 10 with 10 being the most severe.  It is dry cough.  But she is continuing to be plagued by  significant shortness of breath at the level 8/10.  It is present on exertion such as walking across the room and relieved by rest.  This is unimproved and persistent.  She also feels extremely tight in her chest and she feels something is then on the left side of the chest.  She had a chest x-ray in January 2020 but continues to show left hemidiaphragm elevation [again she is not aware of this].  This is upon my personal visualization.  There is no wheezing or edema or chest pain    Chest x-ray personally visualized shows significant left hemidiaphragmatic elevation in January 2020 and November 2019.  These are new findings compared to 2013.  Her exhaled nitric oxide today in our office was normal at 8 ppb  Simple office walk 185 feet x  3 laps goal with forehead probe 10/06/2018   O2 used Room air  Number laps completed 3  Comments about pace fast  Resting Pulse Ox/HR 99% and 83/min  Final Pulse Ox/HR 95% and 101/min  Desaturated </= 88% no  Desaturated <= 3% points Yes, 4 point  Got Tachycardic >/= 90/min yes  Symptoms at end of test Towards 2nd lap dyspneic but able to continue  Miscellaneous comments x     OV 10/29/2018  Subjective:  Patient ID: Erin Snow, female , DOB: 07/07/1966 , age 53 y.o. , MRN: [id number] , ADDRESS:  [address] [place] [address]   10/29/2018 -   Chief Complaint  Patient presents with  . Follow-up    PFT performed 2/19, HRCT and sniff test performed 2/14.  Pt states she is about the same since last visit and states she is still becoming SOB with exertion and has an occ cough. Pt also states she has some discomfort in her chest.     HPI Erin Snow 53 y.o. -presents for follow-up of evaluation of shortness of breath in the setting of possible paralyzed left hemidiaphragm and also prior smoking.  She had a sniff test that shows paralyzed left hemidiaphragm.  She had CT scan of the chest that shows emphysema along with elevated left  hemidiaphragm.  She had pulmonary function test that shows Gold stage II COPD with lower DLCO.  There is some bronchodilator response that is not significant.    IMPRESSION: Paradoxical motion of the left hemidiaphragm consistent with left phrenic nerve palsy.   Electronically Signed   By: Erin Fleming M.D.   On: 10/22/2018 09:50   Results for Erin Snow (MRN [id number]) as of 10/29/2018 10:44  Ref. Range 10/27/2018 08:52  FVC-Pre Latest Units: L 2.75  FVC-%Pred-Pre Latest Units: % 78  FEV1-Pre Latest Units: L 1.77  FEV1-%Pred-Pre Latest Units: % 64  Pre FEV1/FVC ratio Latest Units: % 65  Results for Erin Snow (MRN [id number]) as of 10/29/2018 10:44  Ref. Range 10/27/2018 08:52  FEV1-%Change-Post Latest Units: % 8   Results for Erin Snow (MRN [id number]) as of 10/29/2018 10:44  Ref. Range 10/27/2018 08:52  TLC Latest Units: L 5.23  TLC % pred Latest Units: % 103  RV Latest Units: L 2.55  RV % pred Latest Units: % 139  Results for Erin Snow (MRN [id number]) as of 10/29/2018 10:44  Ref. Range 10/27/2018 08:52  DLCO unc Latest Units: ml/min/mmHg 13.18  DLCO unc % pred Latest Units: % 63   IMPRESSION: 1. Marked elevation of the left hemidiaphragm, compatible with reported left hemidiaphragm paralysis. 2. No evidence of interstitial lung disease. 3. Diffuse bronchial wall thickening with severe centrilobular and paraseptal emphysema; imaging findings suggestive of underlying COPD.  Emphysema (ICD10-J43.9).   Electronically Signed   By: Erin Reed M.D.   On: 10/22/2018 15:04  ROS - per HPI     has no past medical history on file.   reports that she quit smoking about 2 years ago. Her smoking use included cigarettes. She has a 45.00 pack-year smoking history. She has never used smokeless tobacco.  No past surgical history on file.  Allergies  Allergen Reactions  . Oxycodone-Acetaminophen Swelling  . Wellbutrin [Bupropion Hcl] Swelling     Immunization History  Administered Date(s) Administered  . Influenza-Unspecified 06/29/2013  . Tdap 06/29/2013    No family history on file.   Current Outpatient Medications:  .  ALPRAZolam (XANAX) 0.5 MG tablet, , Disp: , Rfl: 0 .  aspirin EC 81 MG tablet, Take 81 mg by mouth daily., Disp: , Rfl:  .  Calcium Carb-Cholecalciferol [phone] MG-UNIT TABS, Take by mouth., Disp: , Rfl:  .  JEVANTIQUE LO 0.5-2.5 MG-MCG tablet, , Disp: , Rfl: 11 .  Nutritional Supplements (JUICE PLUS FIBRE PO), Take 1 capsule by mouth every morning., Disp: , Rfl:  .  sertraline (ZOLOFT) 100 MG tablet, Take 100 mg by mouth daily., Disp: , Rfl:  .  PROAIR HFA 108 (90 Base) MCG/ACT inhaler, , Disp: , Rfl:  Objective:   Vitals:   10/29/18 1024  BP: 98/60  Pulse: 81  SpO2: 97%  Weight: 162 lb (73.5 kg)  Height: 5\' 4"  (1.626 m)    Estimated body mass index is 27.81 kg/m as calculated from the following:   Height as of this encounter: 5\' 4"  (1.626 m).   Weight as of this encounter: 162 lb (73.5 kg).  @WEIGHTCHANGE @  [employer]   10/29/18 1024  Weight: 162 lb (73.5 kg)     Physical Exam Discussion only visit       Assessment:       ICD-10-CM   1. Dyspnea on exertion R06.09   2. 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 Stage 2 moderate COPD by GOLD classification (HCC) J44.9        Plan:     Patient Instructions     ICD-10-CM   1. Dyspnea on exertion R06.09   2. 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 Stage 2 moderate COPD by GOLD classification (HCC) J44.9     Shortness of breath is because of elevated left diaphragm that is paralyzed and also new diagnosis of COPD  Your lung function is around 64% which classifies you as moderate COPD but some of this reduction is because of the diaphragm issue  Glad you quit smoking  Currently CT scan of the chest does not have pulmonary fibrosis or pneumonia or lung cancer and this is good news  PLAN=- -start Spiriva Respimat and take it  scheduled daily; take sample and prescription and learn inhaler technique -Continue albuterol as needed -Check alpha-1 antitrypsin deficiency enzyme phenotype blood test today -Do daily incentive spirometry 10 times each time and at least 5 times daily for the next 1 year in an effort to reactivate your diaphragm  Follow-up - 3 months or sooner   > 50% of this > 25 min visit spent in face to face counseling or coordination of care - by this undersigned MD - Dr Kalman Shan. This includes one or more of the following documented above: discussion of test results, diagnostic or treatment recommendations, prognosis, risks and benefits of management options, instructions, education, compliance or risk-factor reduction   SIGNATURE    Dr. Kalman Shan, M.D., F.C.C.P,  Pulmonary and Critical Care Medicine Staff Physician, [facility] Center Director - Interstitial Lung Disease  Program  Pulmonary Fibrosis [facility] Network at [facility] [place], [place], [address]  Pager: [phone], If no answer or between  15:00h - 7:00h: call [phone] Telephone: [phone]  11:02 AM 10/29/2018

## 2018-11-05 LAB — ALPHA-1 ANTITRYPSIN PHENOTYPE: A-1 Antitrypsin, Ser: 107 mg/dL (ref 83–199)

## 2018-11-06 LAB — ALPHA-1-ANTITRYPSIN DEFICIENCY

## 2018-12-16 ENCOUNTER — Encounter: Payer: Self-pay | Admitting: Internal Medicine

## 2019-01-28 ENCOUNTER — Ambulatory Visit: Payer: BLUE CROSS/BLUE SHIELD | Admitting: Internal Medicine

## 2019-06-27 ENCOUNTER — Ambulatory Visit (INDEPENDENT_AMBULATORY_CARE_PROVIDER_SITE_OTHER): Payer: BC Managed Care – PPO | Admitting: Internal Medicine

## 2019-06-27 ENCOUNTER — Encounter: Payer: Self-pay | Admitting: Internal Medicine

## 2019-06-27 ENCOUNTER — Other Ambulatory Visit: Payer: Self-pay

## 2019-06-27 VITALS — BP 110/80 | HR 77 | Temp 98.1°F | Ht 64.0 in | Wt 158.8 lb

## 2019-06-27 DIAGNOSIS — J986 Disorders of diaphragm: Secondary | ICD-10-CM

## 2019-06-27 DIAGNOSIS — J449 Chronic obstructive pulmonary disease, unspecified: Secondary | ICD-10-CM

## 2019-06-27 NOTE — Progress Notes (Signed)
[facility]   HPI  IOV 10/06/2018   Chief Complaint  Patient presents with  . Pulm Consult    Referred by Rueben Bash PA for SOB. Has had bronchitis in the past and since then, has been more SOB. Unable to keep up with daily activities.    Erin Snow - 53 y.o. [birth year] -of [address] [place] [address] -reports insidious onset of shortness of breath and cough.  Of these the shortness of breath is the more significant problem.  As a background she smoked 1.2 packs/day for 35 years and quit 2 years ago.  She also reports remote history of some kind of a neuromuscular wasting process in her left shoulder and right hand for which she has been evaluated at [facility] [place] review on care everywhere does not show any evidence of this].  Occasionally she gets muscle fasciculations. She has not been bothered by this and the condition has been deemed idiopathic.    Then in October 2019 she slipped and fell on her left side having bruising injuries to her left chest wall and also fracture to her left forearm which was operated [facility].  We do not have a chest x-ray from this time.  Then in November 2019 she says she was gardening when had some mold exposure and then subsequently picked up a bronchitis and was treated with a round of antibiotic and prednisone.  Chest x-ray November 2019 sometime during the illness or after the illness shows a left hemidiaphragm elevation but otherwise clear lung fields [she is unaware of this but will this visit].  This is according my personal visualization.  She states since the respiratory illness she is been left with her shortness of breath and cough.  Between these the cough is a milder symptom.  Currently the cough is continuing to improve and is a level 4 out of 10 with 10 being the most severe.  It is dry cough.  But she is continuing to be plagued by  significant shortness of breath at the level 8/10.  It is present on exertion such as walking across the room and relieved by rest.  This is unimproved and persistent.  She also feels extremely tight in her chest and she feels something is then on the left side of the chest.  She had a chest x-ray in January 2020 but continues to show left hemidiaphragm elevation [again she is not aware of this].  This is upon my personal visualization.  There is no wheezing or edema or chest pain    Chest x-ray personally visualized shows significant left hemidiaphragmatic elevation in January 2020 and November 2019.  These are new findings compared to 2013.  Her exhaled nitric oxide today in our office was normal at 8 ppb  Simple office walk 185 feet x  3 laps goal with forehead probe 10/06/2018   O2 used Room air  Number laps completed 3  Comments about pace fast  Resting Pulse Ox/HR 99% and 83/min  Final Pulse Ox/HR 95% and 101/min  Desaturated </= 88% no  Desaturated <= 3% points Yes, 4 point  Got Tachycardic >/= 90/min yes  Symptoms at end of test Towards 2nd lap dyspneic but able to continue  Miscellaneous comments x     OV 10/29/2018  Subjective:  Patient ID: Erin Snow, female , DOB: Jan 20, 1966 , age 53 y.o. , MRN: [id number] ,  ADDRESS: [place] [address]   10/29/2018 -   Chief Complaint  Patient presents with  . Follow-up    PFT performed 2/19, HRCT and sniff test performed 2/14.  Pt states she is about the same since last visit and states she is still becoming SOB with exertion and has an occ cough. Pt also states she has some discomfort in her chest.     HPI TAVI GAUGHRAN 53 y.o. -presents for follow-up of evaluation of shortness of breath in the setting of possible paralyzed left hemidiaphragm and also prior smoking.  She had a sniff test that shows paralyzed left hemidiaphragm.  She had CT scan of the chest that shows emphysema along with elevated left  hemidiaphragm.  She had pulmonary function test that shows Gold stage II COPD with lower DLCO.  There is some bronchodilator response that is not significant.    IMPRESSION: Paradoxical motion of the left hemidiaphragm consistent with left phrenic nerve palsy.   Electronically Signed   By: Genevie Ann M.D.   On: 10/22/2018 09:50   Results for INETHA, MARET (MRN [id number]) as of 10/29/2018 10:44  Ref. Range 10/27/2018 08:52  FVC-Pre Latest Units: L 2.75  FVC-%Pred-Pre Latest Units: % 78  FEV1-Pre Latest Units: L 1.77  FEV1-%Pred-Pre Latest Units: % 64  Pre FEV1/FVC ratio Latest Units: % 65  Results for CAILY, RAKERS (MRN [id number]) as of 10/29/2018 10:44  Ref. Range 10/27/2018 08:52  FEV1-%Change-Post Latest Units: % 8   Results for JANAIYAH, BLACKARD (MRN [id number]) as of 10/29/2018 10:44  Ref. Range 10/27/2018 08:52  TLC Latest Units: L 5.23  TLC % pred Latest Units: % 103  RV Latest Units: L 2.55  RV % pred Latest Units: % 139  Results for TITIANNA, LOOMIS (MRN [id number]) as of 10/29/2018 10:44  Ref. Range 10/27/2018 08:52  DLCO unc Latest Units: ml/min/mmHg 13.18  DLCO unc % pred Latest Units: % 63   IMPRESSION: 1. Marked elevation of the left hemidiaphragm, compatible with reported left hemidiaphragm paralysis. 2. No evidence of interstitial lung disease. 3. Diffuse bronchial wall thickening with severe centrilobular and paraseptal emphysema; imaging findings suggestive of underlying COPD.  Emphysema (ICD10-J43.9).   Electronically Signed   By: Vinnie Langton M.D.   On: 10/22/2018 15:04  ROS - per HPI  OV 06/27/2019  Subjective:  Patient ID: Erin Snow, female , DOB: Jun 19, 1966 , age 53 y.o. , MRN: [id number] , ADDRESS: [place] [address]   06/27/2019 -   Chief Complaint  Patient presents with  . Follow-up    COPD. Using albuterol prn and spiriva everyday. No new concerns voiced. Decline Flu shot   Gold stage 2 COPD - MS  Elevated left hemidiphragm - post fall  HPI Erin Snow 53 y.o. -returns for follow-up.  Seen earlier in the year for combination of stage II COPD and also posttraumatic left hemidiaphragm elevation with paralyzed left hemidiaphragm.  She tells me that she has been doing well with social distancing.  No COVID-19.  She states approximately 6-8 weeks ago she felt 10 seconds of fluttering in her heart and from the very next day she started feeling improved shortness of breath.  She feels that the left hemidiaphragm paralysis is resolved.  She does not want any testing for this.  She just feels better.  She is better to the point that she is reduced her Spiriva to 1 puff once daily instead of 2  puffs once daily.  She takes albuterol as needed but hardly ever needs it.  She categorically declined taking a flu shot today.  Review of the test results show that her alpha-1 is MS.  She has quit smoking.  She is agreeable to having pulmonary function test done at follow-up.     ROS - per HPI     has no past medical history on file.   reports that she quit smoking about 3 years ago. Her smoking use included cigarettes. She has a 45.00 pack-year smoking history. She has never used smokeless tobacco.  No past surgical history on file.  Allergies  Allergen Reactions  . Oxycodone-Acetaminophen Swelling  . Wellbutrin [Bupropion Hcl] Swelling    Immunization History  Administered Date(s) Administered  . Influenza-Unspecified 06/29/2013  . Tdap 06/29/2013    No family history on file.   Current Outpatient Medications:  .  ALPRAZolam (XANAX) 0.5 MG tablet, , Disp: , Rfl: 0 .  aspirin EC 81 MG tablet, Take 81 mg by mouth daily., Disp: , Rfl:  .  Calcium Carb-Cholecalciferol [phone] MG-UNIT TABS, Take by mouth., Disp: , Rfl:  .  JEVANTIQUE LO 0.5-2.5 MG-MCG tablet, , Disp: , Rfl: 11 .  Nutritional Supplements (JUICE PLUS FIBRE PO), Take 1 capsule by mouth every morning., Disp: , Rfl:  .   PROAIR HFA 108 (90 Base) MCG/ACT inhaler, , Disp: , Rfl:  .  sertraline (ZOLOFT) 100 MG tablet, Take 100 mg by mouth daily., Disp: , Rfl:  .  Tiotropium Bromide Monohydrate (SPIRIVA RESPIMAT) 2.5 MCG/ACT AERS, Inhale 2 puffs into the lungs daily., Disp: 1 Inhaler, Rfl: 11      Objective:   Vitals:   06/27/19 1007  BP: 110/80  Pulse: 77  Temp: 98.1 F (36.7 C)  TempSrc: Temporal  SpO2: 95%  Weight: 158 lb 12.8 oz (72 kg)  Height: 5\' 4"  (1.626 m)    Estimated body mass index is 27.26 kg/m as calculated from the following:   Height as of this encounter: 5\' 4"  (1.626 m).   Weight as of this encounter: 158 lb 12.8 oz (72 kg).  @WEIGHTCHANGE @  [employer]   06/27/19 1007  Weight: 158 lb 12.8 oz (72 kg)     Physical Exam  General Appearance:    Alert, cooperative, no distress, appears stated age - yes , Deconditioned looking - no , OBESE  - no, Sitting on Wheelchair -  no  Head:    Normocephalic, without obvious abnormality, atraumatic  Eyes:    PERRL, conjunctiva/corneas clear,  Ears:    Normal TM's and external ear canals, both ears  Nose:   Nares normal, septum midline, mucosa normal, no drainage    or sinus tenderness. OXYGEN ON  - no . Patient is @ ra   Throat:   Lips, mucosa, and tongue normal; teeth and gums normal. Cyanosis on lips - no  Neck:   Supple, symmetrical, trachea midline, no adenopathy;    thyroid:  no enlargement/tenderness/nodules; no carotid   bruit or JVD  Back:     Symmetric, no curvature, ROM normal, no CVA tenderness  Lungs:     Distress - no , Wheeze no, Barrell Chest - no, Purse lip breathing - no, Crackles - no   Chest Wall:    No tenderness or deformity.    Heart:    Regular rate and rhythm, S1 and S2 normal, no rub   or gallop, Murmur - no  Breast Exam:  NOT DONE  Abdomen:     Soft, non-tender, bowel sounds active all four quadrants,    no masses, no organomegaly. Visceral obesity - no  Genitalia:   NOT DONE  Rectal:   NOT DONE   Extremities:   Extremities - normal, Has Cane - no, Clubbing - no, Edema - no  Pulses:   2+ and symmetric all extremities  Skin:   Stigmata of Connective Tissue Disease - no  Lymph nodes:   Cervical, supraclavicular, and axillary nodes normal  Psychiatric:  Neurologic:   Pleasant - yes, Anxious - no, Flat affect - no  CAm-ICU - neg, Alert and Oriented x 3 - yes, Moves all 4s - yes, Speech - normal, Cognition - intact           Assessment:       ICD-10-CM   1. Stage 2 moderate COPD by GOLD classification (HCC)  J44.9   2. Elevated diaphragm  J98.6        Plan:     Patient Instructions  Stage 2 moderate COPD by GOLD classification (HCC)  stble disease without flare up - your alpha 1 is MS - where the S Gene carries slightly low protection to lungs  Plan  - respect flu shot deferral  - continue spiriva at least 1 puff once daily with albuterol as needed   - helps symptoms and prevents flare up  - if going to trial without it try 2 weeks without it  - do repeat spirometry in 9-12 months at time of followup  - get kids to get their alpha 1 genetic test done  Elevated diaphragm  - clinically improved  Plan  - respect deferral on repeat sniff test  - will follow clinically  Followup 9-12 months or sooner if needed     SIGNATURE    Dr. Kalman Shan, M.D., F.C.C.P,  Pulmonary and Critical Care Medicine Staff Physician, [facility] Center Director - Interstitial Lung Disease  Program  Pulmonary Fibrosis [facility] Network at [facility] [place], [place], [address]  Pager: [phone], If no answer or between  15:00h - 7:00h: call [phone] Telephone: [phone]  10:58 AM 06/27/2019

## 2019-06-27 NOTE — Patient Instructions (Addendum)
Stage 2 moderate COPD by GOLD classification ([place])  stble disease without flare up - your alpha 1 is MS - where the S Gene carries slightly low protection to lungs  Plan  - respect flu shot deferral  - continue spiriva at least 1 puff once daily with albuterol as needed   - helps symptoms and prevents flare up  - if going to trial without it try 2 weeks without it  - do repeat spirometry in 9-12 months at time of followup  - get kids to get their alpha 1 genetic test done  Elevated diaphragm  - clinically improved  Plan  - respect deferral on repeat sniff test  - will follow clinically  Followup 9-12 months or sooner if needed

## 2019-10-21 ENCOUNTER — Other Ambulatory Visit: Payer: Self-pay | Admitting: Internal Medicine

## 2019-12-09 IMAGING — RF DG SNIFF TEST
5 series · 19 of 19 positions shown · non-contrast
Comparison: Chest radiographs 09/24/2018 through 12/20/2011.

CLINICAL DATA: 52-year-old female with shortness of breath and
elevated left hemidiaphragm.

EXAM:
CHEST FLUOROSCOPY
TECHNIQUE: Real-time fluoroscopic evaluation of the chest was performed.
FLUOROSCOPY TIME:  Fluoroscopy Time:  0 minutes 24 seconds
Radiation Exposure Index (if provided by the fluoroscopic device):
1.2 mGy
Number of Acquired Spot Images: 0

[Series 1: cp_chest · 0.52mm/px · 3 of 3 frames shown (1 of 5)]
[frame 1/3]
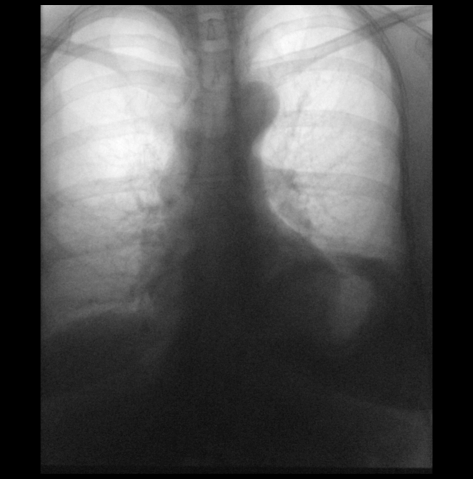
[frame 2/3]
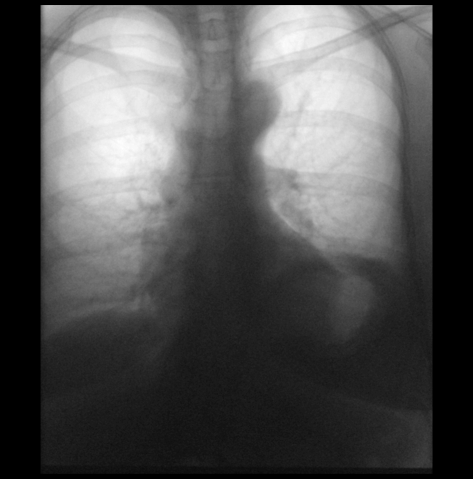
[frame 3/3]
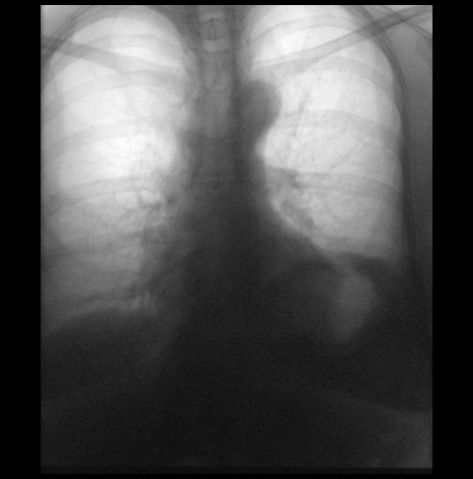

[Series 2: cp_chest · 0.52mm/px · 4 of 39 frames shown (2 of 5)]
[frame 6/39]
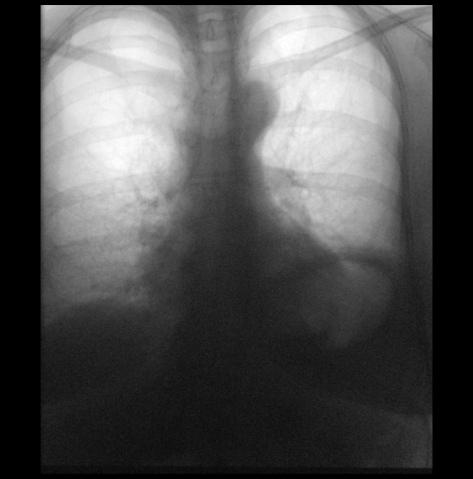
[frame 20/39]
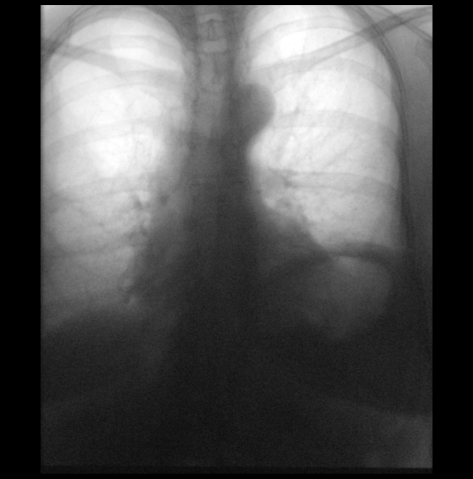
[frame 34/39]
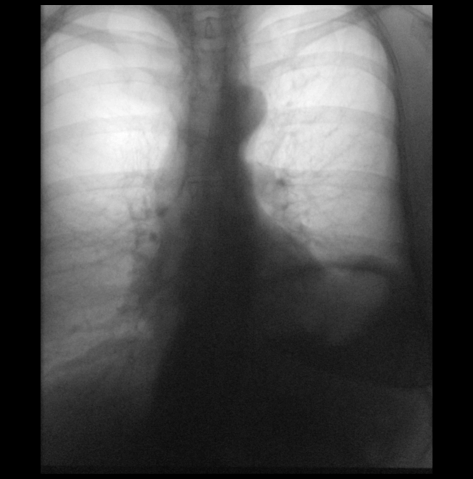
[frame 39/39]
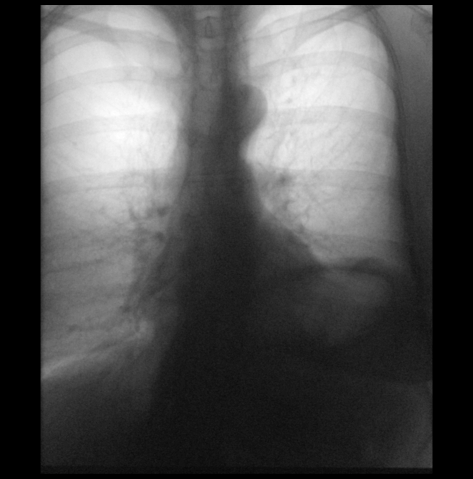

[Series 3: cp_chest · 0.52mm/px · 4 of 64 frames shown (3 of 5)]
[frame 10/64]
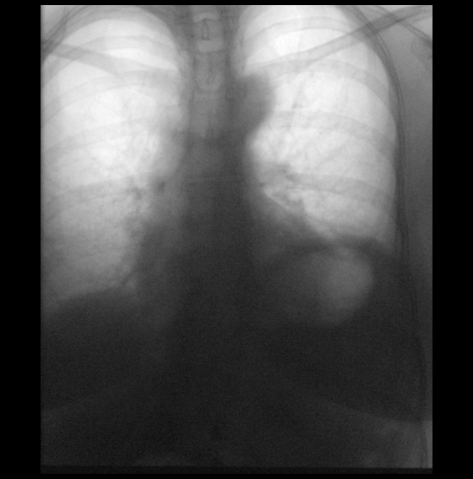
[frame 20/64]
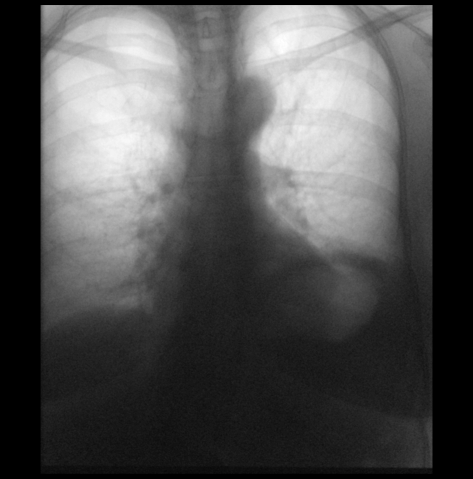
[frame 33/64]
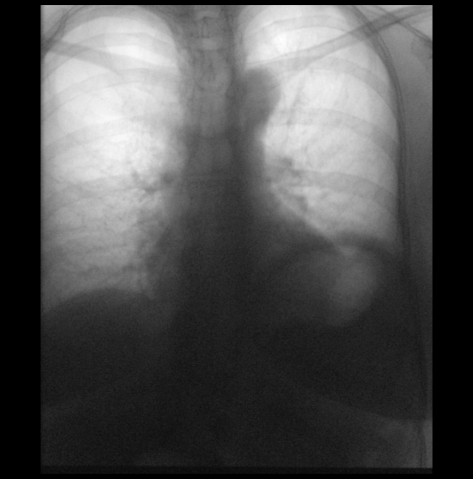
[frame 55/64]
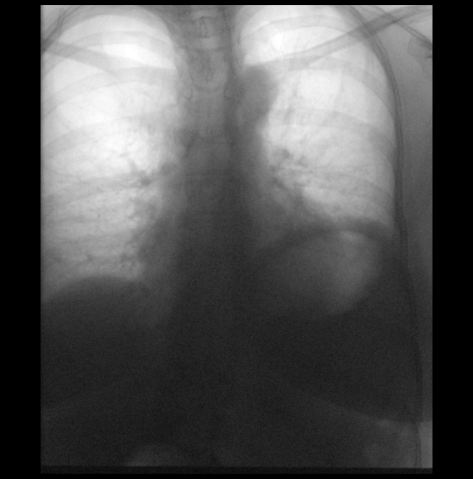

[Series 4: cp_chest · 0.52mm/px · 4 of 39 frames shown (4 of 5)]
[frame 6/39]
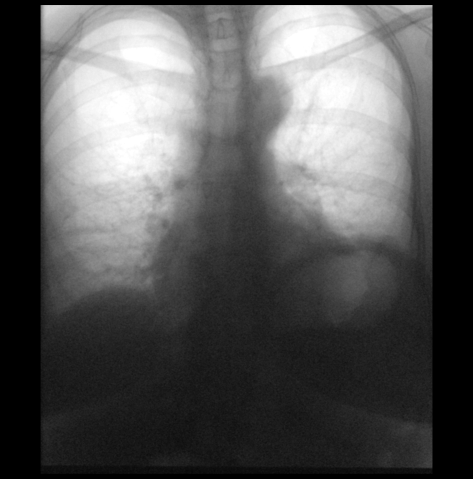
[frame 20/39]
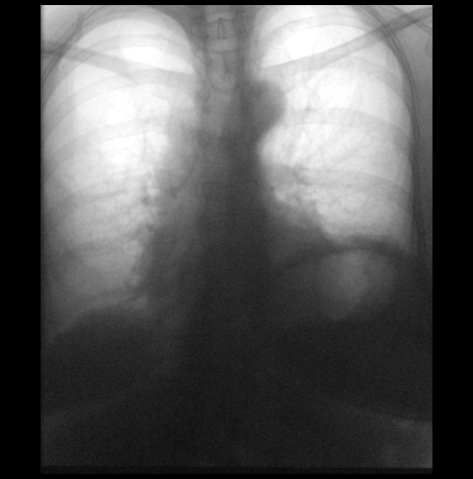
[frame 34/39]
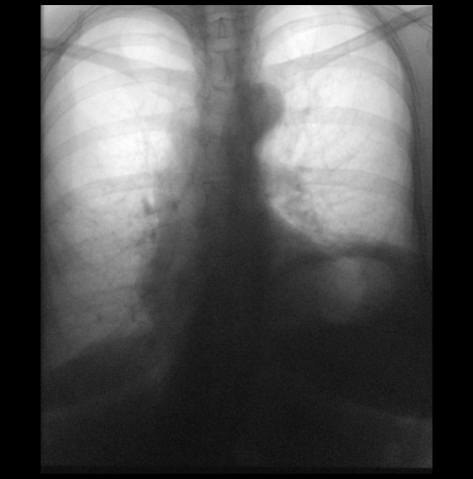
[frame 39/39]
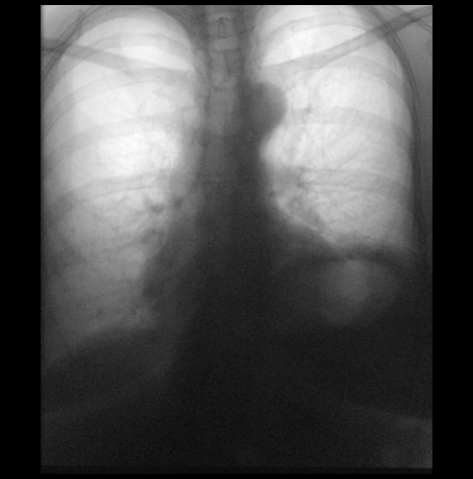

[Series 5: cp_chest · 0.52mm/px · 4 of 32 frames shown (5 of 5)]
[frame 5/32]
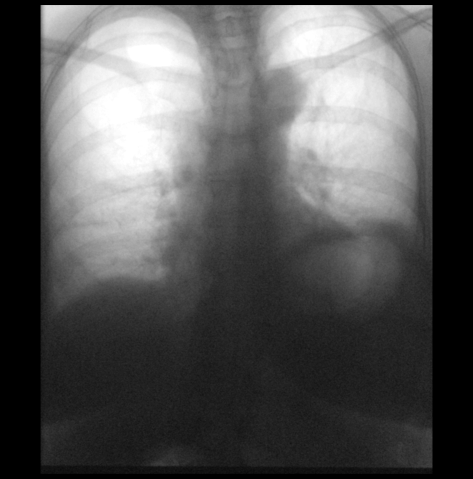
[frame 17/32]
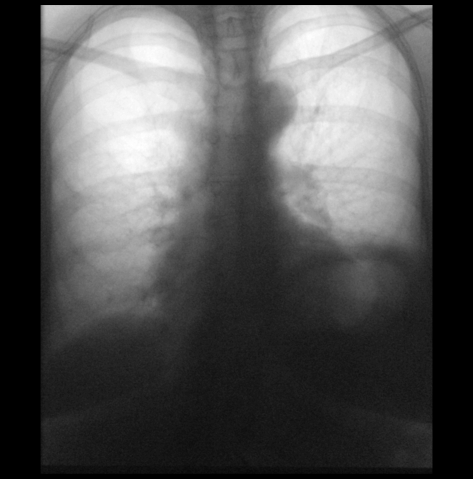
[frame 28/32]
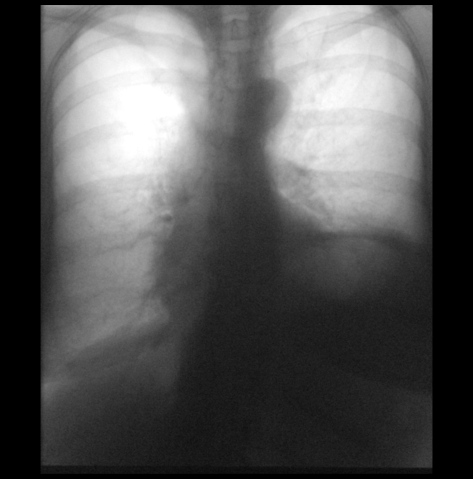
[frame 32/32]
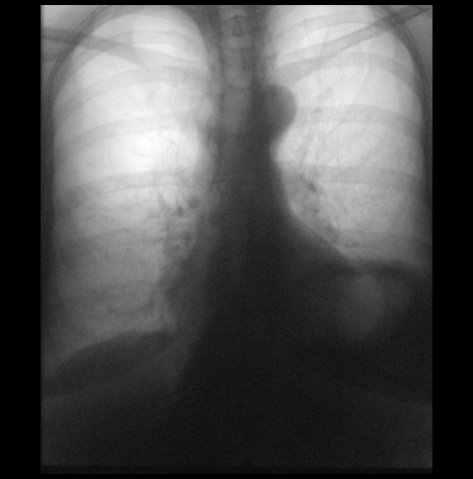

[19 of 19 positions shown; findings below may reference images not displayed]

FINDINGS: Symmetric hemidiaphragms noted on the 3109 chest radiographs. Left
hemidiaphragm was elevated on July 2018 radiographs.

The patient was observed with chest fluoroscopy during normal quiet
breathing, forced inspiration, expiration, and forceful sniff.

Most convincing during the forceful sniff (series 5, image 26),
there is paradoxical motion of the left hemidiaphragm.
IMPRESSION: Paradoxical motion of the left hemidiaphragm consistent with left
phrenic nerve palsy.

## 2020-05-28 ENCOUNTER — Other Ambulatory Visit: Payer: Self-pay

## 2020-05-28 ENCOUNTER — Ambulatory Visit: Payer: BC Managed Care – PPO | Admitting: Internal Medicine

## 2020-05-28 ENCOUNTER — Encounter: Payer: Self-pay | Admitting: Internal Medicine

## 2020-05-28 VITALS — BP 122/68 | HR 63 | Temp 93.6°F | Ht 64.0 in | Wt 156.8 lb

## 2020-05-28 DIAGNOSIS — Z7185 Encounter for immunization safety counseling: Secondary | ICD-10-CM

## 2020-05-28 DIAGNOSIS — J986 Disorders of diaphragm: Secondary | ICD-10-CM

## 2020-05-28 DIAGNOSIS — Z87891 Personal history of nicotine dependence: Secondary | ICD-10-CM

## 2020-05-28 DIAGNOSIS — J449 Chronic obstructive pulmonary disease, unspecified: Secondary | ICD-10-CM

## 2020-05-28 DIAGNOSIS — Z7189 Other specified counseling: Secondary | ICD-10-CM

## 2020-05-28 NOTE — Progress Notes (Signed)
[facility]P [facility]   HPI  IOV 10/06/2018   Chief Complaint  Patient presents with  . Pulm Consult    Referred by Rueben Bash PA for SOB. Has had bronchitis in the past and since then, has been more SOB. Unable to keep up with daily activities.    Erin Snow - 54 y.o. [birth year] -of [address] [place] [address] -reports insidious onset of shortness of breath and cough.  Of these the shortness of breath is the more significant problem.  As a background she smoked 1.2 packs/day for 35 years and quit 2 years ago.  She also reports remote history of some kind of a neuromuscular wasting process in her left shoulder and right hand for which she has been evaluated at [facility] and [place] years ago [place] review on care everywhere does not show any evidence of this].  Occasionally she gets muscle fasciculations. She has not been bothered by this and the condition has been deemed idiopathic.    Then in October 2019 she slipped and fell on her left side having bruising injuries to her left chest wall and also fracture to her left forearm which was operated [facility].  We do not have a chest x-ray from this time.  Then in November 2019 she says she was gardening when had some mold exposure and then subsequently picked up a bronchitis and was treated with a round of antibiotic and prednisone.  Chest x-ray November 2019 sometime during the illness or after the illness shows a left hemidiaphragm elevation but otherwise clear lung fields [she is unaware of this but will this visit].  This is according my personal visualization.  She states since the respiratory illness she is been left with her shortness of breath and cough.  Between these the cough is a milder symptom.  Currently the cough is continuing to improve and is a level 4 out of 10 with 10 being the most severe.  It is dry cough.  But she is continuing to be plagued by  significant shortness of breath at the level 8/10.  It is present on exertion such as walking across the room and relieved by rest.  This is unimproved and persistent.  She also feels extremely tight in her chest and she feels something is then on the left side of the chest.  She had a chest x-ray in January 2020 but continues to show left hemidiaphragm elevation [again she is not aware of this].  This is upon my personal visualization.  There is no wheezing or edema or chest pain    Chest x-ray personally visualized shows significant left hemidiaphragmatic elevation in January 2020 and November 2019.  These are new findings compared to 2013.  Her exhaled nitric oxide today in our office was normal at 8 ppb  Simple office walk 185 feet x  3 laps goal with forehead probe 10/06/2018   O2 used Room air  Number laps completed 3  Comments about pace fast  Resting Pulse Ox/HR 99% and 83/min  Final Pulse Ox/HR 95% and 101/min  Desaturated </= 88% no  Desaturated <= 3% points Yes, 4 point  Got Tachycardic >/= 90/min yes  Symptoms at end of test Towards 2nd lap dyspneic but able to continue  Miscellaneous comments x     OV 10/29/2018  Subjective:  Patient ID: Elmer Bales, female , DOB: 02-09-1966 , age 9 y.o. , MRN: [id number] ,  ADDRESS: [address] [facility] [address]   10/29/2018 -   Chief Complaint  Patient presents with  . Follow-up    PFT performed 2/19, HRCT and sniff test performed 2/14.  Pt states she is about the same since last visit and states she is still becoming SOB with exertion and has an occ cough. Pt also states she has some discomfort in her chest.     HPI MIRAI GREENWOOD 54 y.o. -presents for follow-up of evaluation of shortness of breath in the setting of possible paralyzed left hemidiaphragm and also prior smoking.  She had a sniff test that shows paralyzed left hemidiaphragm.  She had CT scan of the chest that shows emphysema along with elevated left  hemidiaphragm.  She had pulmonary function test that shows Gold stage II COPD with lower DLCO.  There is some bronchodilator response that is not significant.    IMPRESSION: Paradoxical motion of the left hemidiaphragm consistent with left phrenic nerve palsy.   Electronically Signed   By: Odessa Fleming M.D.   On: 10/22/2018 09:50   Results for RYONNA, CIMINI (MRN [id number]) as of 10/29/2018 10:44  Ref. Range 10/27/2018 08:52  FVC-Pre Latest Units: L 2.75  FVC-%Pred-Pre Latest Units: % 78  FEV1-Pre Latest Units: L 1.77  FEV1-%Pred-Pre Latest Units: % 64  Pre FEV1/FVC ratio Latest Units: % 65  Results for SAGA, BALTHAZAR (MRN [id number]) as of 10/29/2018 10:44  Ref. Range 10/27/2018 08:52  FEV1-%Change-Post Latest Units: % 8   Results for FELIZA, DIVEN (MRN [id number]) as of 10/29/2018 10:44  Ref. Range 10/27/2018 08:52  TLC Latest Units: L 5.23  TLC % pred Latest Units: % 103  RV Latest Units: L 2.55  RV % pred Latest Units: % 139  Results for CHASIDY, JANAK (MRN [id number]) as of 10/29/2018 10:44  Ref. Range 10/27/2018 08:52  DLCO unc Latest Units: ml/min/mmHg 13.18  DLCO unc % pred Latest Units: % 63   IMPRESSION: 1. Marked elevation of the left hemidiaphragm, compatible with reported left hemidiaphragm paralysis. 2. No evidence of interstitial lung disease. 3. Diffuse bronchial wall thickening with severe centrilobular and paraseptal emphysema; imaging findings suggestive of underlying COPD.  Emphysema (ICD10-J43.9).   Electronically Signed   By: Trudie Reed M.D.   On: 10/22/2018 15:04  ROS - per HPI  OV 06/27/2019  Subjective:  Patient ID: Elmer Bales, female , DOB: 06-Mar-1966 , age 81 y.o. , MRN: [id number] , ADDRESS: [address] [place] [address]   06/27/2019 -   Chief Complaint  Patient presents with  . Follow-up    COPD. Using albuterol prn and spiriva everyday. No new concerns voiced. Decline Flu shot   Gold stage 2 COPD -  MS Elevated left hemidiphragm - post fall  HPI QUINNE PIRES 54 y.o. -returns for follow-up.  Seen earlier in the year for combination of stage II COPD and also posttraumatic left hemidiaphragm elevation with paralyzed left hemidiaphragm.  She tells me that she has been doing well with social distancing.  No COVID-19.  She states approximately 6-8 weeks ago she felt 10 seconds of fluttering in her heart and from the very next day she started feeling improved shortness of breath.  She feels that the left hemidiaphragm paralysis is resolved.  She does not want any testing for this.  She just feels better.  She is better to the point that she is reduced her Spiriva to 1 puff once daily instead of 2  puffs once daily.  She takes albuterol as needed but hardly ever needs it.  She categorically declined taking a flu shot today.  Review of the test results show that her alpha-1 is MS.  She has quit smoking.  She is agreeable to having pulmonary function test done at follow-up.     ROS - per HPI     has no past medi  OV 05/28/2020  Subjective:  Patient ID: Elmer BalesSusan L Norkus, female , DOB: [birth year] , age 54 y.o. , MRN: [id number] , AD[address]ESS: [address] [place] [id number]   05/28/2020 -   Chief Complaint  Patient presents with  . Follow-up    pt states stop taking spirivia was causing mouth issues gums hurting and tongue swelling   Gold stage 2 COPD - MS Elevated left hemidiphragm - post fall -clinical follow-up > 30 pack former smoker  HPI Elmer BalesSusan L Cottam 54 y.o. -presents for COPD follow-up.  Last seen approximately a year ago.  Since then she has stopped her Spiriva.  She tells me that Spiriva is causing chest tightness and also oral and throat irritation.  Therefore she does not want to do the Spiriva.  She in fact feels better and improved shortness of breath after stopping the Spiriva.  In terms of her elevated left hemidiaphragm.  She does not want any follow-up sniff test based  on prior office visit  In terms of smoking: This is under remission and continues to be under remission  Vaccine counseling: She is always refused the flu shot in the past.  She tells me that she does not want to do the flu shot.  She does not want to do the Covid vaccine either.  I have respected her individual right about this.     PFT Results Latest Ref Rng & Units 10/27/2018  FVC-Pre L 2.75  FVC-Predicted Pre % 78  FVC-Post L 2.92  FVC-Predicted Post % 83  Pre FEV1/FVC % % 65  Post FEV1/FCV % % 66  FEV1-Pre L 1.77  FEV1-Predicted Pre % 64  FEV1-Post L 1.92  DLCO uncorrected ml/min/mmHg 13.18  DLCO UNC% % 63  DLVA Predicted % 71  TLC L 5.23  TLC % Predicted % 103  RV % Predicted % 139   CT chest feb 2020  IMPRESSION: 1. Marked elevation of the left hemidiaphragm, compatible with reported left hemidiaphragm paralysis. 2. No evidence of interstitial lung disease. 3. Diffuse bronchial wall thickening with severe centrilobular and paraseptal emphysema; imaging findings suggestive of underlying COPD.  Emphysema (ICD10-J43.9).   Electronically Signed   By: Trudie Reedaniel  Entrikin M.D.   On: 10/22/2018 15:04  ROS - per HPI     has no past medical history on file.   reports that she quit smoking about 4 years ago. Her smoking use included cigarettes. She has a 45.00 pack-year smoking history. She has never used smokeless tobacco.  No past surgical history on file.  Allergies  Allergen Reactions  . Oxycodone-Acetaminophen Swelling  . Wellbutrin [Bupropion Hcl] Swelling    Immunization History  Administered Date(s) Administered  . Influenza-Unspecified 06/29/2013  . Tdap 06/29/2013    No family history on file.   Current Outpatient Medications:  .  ALPRAZolam (XANAX) 0.5 MG tablet, , Disp: , Rfl: 0 .  aspirin EC 81 MG tablet, Take 81 mg by mouth daily., Disp: , Rfl:  .  Calcium Carb-Cholecalciferol [phone] MG-UNIT TABS, Take by mouth., Disp: , Rfl:  .   JEVANTIQUE LO 0.5-2.5  MG-MCG tablet, , Disp: , Rfl: 11 .  Nutritional Supplements (JUICE PLUS FIBRE PO), Take 1 capsule by mouth every morning., Disp: , Rfl:  .  PROAIR HFA 108 (90 Base) MCG/ACT inhaler, , Disp: , Rfl:  .  sertraline (ZOLOFT) 100 MG tablet, Take 100 mg by mouth daily., Disp: , Rfl:       Objective:   Vitals:   05/28/20 1039  BP: 122/68  Pulse: 63  Temp: (!) 93.6 F (34.2 C)  TempSrc: Oral  SpO2: 94%  Weight: 156 lb 12.8 oz (71.1 kg)  Height: 5\' 4"  (1.626 m)    Estimated body mass index is 26.91 kg/m as calculated from the following:   Height as of this encounter: 5\' 4"  (1.626 m).   Weight as of this encounter: 156 lb 12.8 oz (71.1 kg).  @WEIGHTCHANGE @    05/28/20 1039  Weight: 156 lb 12.8 oz (71.1 kg)     Physical Exam  General: No distress.  Neuro: Alert and Oriented x 3. GCS 15. Speech normal Psych: Pleasant Resp: Clear to ausucultation bilaterally. No wheeze No crackles CVS: Normal heart sounds. Murmurs - no HEENT: Normal upper airway. PEERL +. No post nasal drip         Assessment:       ICD-10-CM   1. Stage 2 moderate COPD by GOLD classification (HCC)  J44.9   2. Elevated diaphragm  J98.6   3. Stopped smoking with greater than 30 pack year history  Z87.891   4. Vaccine counseling  Z71.89        Plan:     Patient Instructions  Stage 2 moderate COPD by GOLD classification (HCC) Vaccine counseling  stble disease without flare up - your alpha 1 is MS - where the S Gene carries slightly low protection to lungs - glad you are doing well without spiriva  Plan  - respect flu shot and covid vaccine deferral  - do albuterol as needed - get kids to get their alpha 1 genetic test done  - weebsite www. - do spirometry and dlco in 1 year - once you turn 54 years of age - Lung cancer screening will be recommended  - we can discuss in future the approach    Followup 9-12 months or sooner if needed  - CAT  score at followup     SIGNATURE    Dr. 05/30/20, M.D., F.C.C.P,  Pulmonary and Critical Care Medicine Staff Physician, [facility] Director - Interstitial Lung Disease  Program  Pulmonary Fibrosis [facility] at [facility] [place], [facility], [facility]  Pager: [phone], If no answer or between  15:00h - 7:00h: call [phone] Telephone: [phone]  11:15 AM 05/28/2020

## 2020-05-28 NOTE — Patient Instructions (Addendum)
Stage 2 moderate COPD by GOLD classification (HCC) Vaccine counseling  stble disease without flare up - your alpha 1 is MS - where the S Gene carries slightly low protection to lungs - glad you are doing well without spiriva  Plan  - respect flu shot and covid vaccine deferral  - do albuterol as needed - get kids to get their alpha 1 genetic test done  - website [email or web address] - do spirometry and dlco in 1 year - once you turn 54 years of age - Lung cancer screening will be recommended  - we can discuss in future the approach    Followup 9-12 months or sooner if needed  - CAT score at followup

## 2023-03-24 ENCOUNTER — Other Ambulatory Visit (HOSPITAL_COMMUNITY): Payer: Self-pay
